# Patient Record
Sex: Male | Born: 1955 | Race: White | Hispanic: No | Marital: Married | Smoking: Never smoker
Health system: Southern US, Community
[De-identification: ages and names within clinical notes are randomized; demographics above are authoritative.]

## PROBLEM LIST (undated history)

## (undated) DIAGNOSIS — E119 Type 2 diabetes mellitus without complications: Secondary | ICD-10-CM

## (undated) DIAGNOSIS — G8929 Other chronic pain: Secondary | ICD-10-CM

## (undated) HISTORY — PX: NO PAST SURGERIES: SHX2092

## (undated) HISTORY — PX: APPENDECTOMY: SHX54

---

## 2004-05-24 ENCOUNTER — Ambulatory Visit: Payer: Self-pay | Admitting: Cardiovascular Disease

## 2004-12-23 ENCOUNTER — Emergency Department: Payer: Self-pay | Admitting: Emergency Medicine

## 2005-04-01 ENCOUNTER — Other Ambulatory Visit: Payer: Self-pay

## 2005-04-02 ENCOUNTER — Inpatient Hospital Stay: Payer: Self-pay | Admitting: General Surgery

## 2005-04-24 ENCOUNTER — Ambulatory Visit: Payer: Self-pay | Admitting: General Surgery

## 2005-05-18 ENCOUNTER — Ambulatory Visit: Payer: Self-pay | Admitting: General Surgery

## 2005-06-29 ENCOUNTER — Emergency Department: Payer: Self-pay | Admitting: Emergency Medicine

## 2005-08-26 ENCOUNTER — Emergency Department: Payer: Self-pay | Admitting: Emergency Medicine

## 2006-05-08 ENCOUNTER — Emergency Department (HOSPITAL_COMMUNITY): Admission: EM | Admit: 2006-05-08 | Discharge: 2006-05-09 | Payer: Self-pay | Admitting: Emergency Medicine

## 2007-01-01 ENCOUNTER — Emergency Department: Payer: Self-pay | Admitting: Emergency Medicine

## 2007-05-21 ENCOUNTER — Ambulatory Visit: Payer: Self-pay | Admitting: Internal Medicine

## 2007-06-25 ENCOUNTER — Other Ambulatory Visit: Payer: Self-pay

## 2007-06-25 ENCOUNTER — Ambulatory Visit: Payer: Self-pay | Admitting: Ophthalmology

## 2007-06-26 ENCOUNTER — Ambulatory Visit: Payer: Self-pay | Admitting: Ophthalmology

## 2007-07-16 ENCOUNTER — Ambulatory Visit: Payer: Self-pay | Admitting: Specialist

## 2008-08-03 ENCOUNTER — Emergency Department: Payer: Self-pay | Admitting: Emergency Medicine

## 2009-06-06 ENCOUNTER — Emergency Department: Payer: Self-pay | Admitting: Emergency Medicine

## 2009-08-19 ENCOUNTER — Ambulatory Visit: Payer: Self-pay | Admitting: Ophthalmology

## 2009-08-31 ENCOUNTER — Ambulatory Visit: Payer: Self-pay | Admitting: Ophthalmology

## 2009-10-25 ENCOUNTER — Emergency Department: Payer: Self-pay | Admitting: Unknown Physician Specialty

## 2010-08-10 ENCOUNTER — Emergency Department: Payer: Self-pay | Admitting: Unknown Physician Specialty

## 2010-12-02 ENCOUNTER — Emergency Department: Payer: Self-pay | Admitting: Emergency Medicine

## 2011-04-04 ENCOUNTER — Emergency Department: Payer: Self-pay | Admitting: Emergency Medicine

## 2011-05-11 ENCOUNTER — Emergency Department: Payer: Self-pay | Admitting: Emergency Medicine

## 2011-05-15 ENCOUNTER — Emergency Department: Payer: Self-pay | Admitting: Emergency Medicine

## 2011-07-06 ENCOUNTER — Ambulatory Visit: Payer: Self-pay | Admitting: Cardiovascular Disease

## 2011-12-18 ENCOUNTER — Emergency Department: Payer: Self-pay | Admitting: Emergency Medicine

## 2012-06-21 ENCOUNTER — Emergency Department: Payer: Self-pay | Admitting: Emergency Medicine

## 2012-10-08 ENCOUNTER — Emergency Department: Payer: Self-pay | Admitting: Emergency Medicine

## 2012-10-08 LAB — BASIC METABOLIC PANEL
Anion Gap: 6 — ABNORMAL LOW (ref 7–16)
BUN: 15 mg/dL (ref 7–18)
Calcium, Total: 9.2 mg/dL (ref 8.5–10.1)
Chloride: 108 mmol/L — ABNORMAL HIGH (ref 98–107)
Co2: 27 mmol/L (ref 21–32)
Creatinine: 0.71 mg/dL (ref 0.60–1.30)
EGFR (African American): >60
EGFR (Non-African Amer.): >60
Sodium: 141 mmol/L (ref 136–145)

## 2012-10-08 LAB — CBC
MCHC: 34.3 g/dL (ref 32.0–36.0)
Platelet: 286 10*3/uL (ref 150–440)
WBC: 10 10*3/uL (ref 3.8–10.6)

## 2012-10-08 LAB — CK TOTAL AND CKMB (NOT AT ARMC)
CK, Total: 56 U/L (ref 35–232)
CK-MB: 0.9 ng/mL (ref 0.5–3.6)

## 2014-03-26 ENCOUNTER — Emergency Department: Payer: Self-pay | Admitting: Emergency Medicine

## 2015-10-29 ENCOUNTER — Emergency Department
Admission: EM | Admit: 2015-10-29 | Discharge: 2015-10-29 | Disposition: A | Discharge status: Home / Self Care | Payer: Medicare Other | Attending: Emergency Medicine | Admitting: Emergency Medicine

## 2015-10-29 ENCOUNTER — Emergency Department: Payer: Medicare Other

## 2015-10-29 DIAGNOSIS — M545 Low back pain: Secondary | ICD-10-CM | POA: Diagnosis present

## 2015-10-29 DIAGNOSIS — M5442 Lumbago with sciatica, left side: Secondary | ICD-10-CM | POA: Diagnosis not present

## 2015-10-29 MED ORDER — MELOXICAM 15 MG PO TABS: 15.0000 mg | ORAL_TABLET | Freq: Every day | ORAL | 0 refills | Status: DC

## 2015-10-29 MED ORDER — METHOCARBAMOL 500 MG PO TABS: 500.0000 mg | ORAL_TABLET | Freq: Four times a day (QID) | ORAL | 0 refills | Status: DC

## 2015-10-29 MED ORDER — KETOROLAC TROMETHAMINE 30 MG/ML IJ SOLN
INTRAMUSCULAR | Status: AC
  Filled 2015-10-29: qty 1

## 2015-10-29 MED ORDER — HYDROCODONE-ACETAMINOPHEN 5-325 MG PO TABS: 1.0000 | ORAL_TABLET | ORAL | 0 refills | Status: DC | PRN

## 2015-10-29 MED ORDER — KETOROLAC TROMETHAMINE 30 MG/ML IJ SOLN
30.0000 mg | Freq: Once | INTRAMUSCULAR | Status: AC
  Administered 2015-10-29: 30 mg via INTRAMUSCULAR

## 2015-10-29 NOTE — ED Triage Notes (Signed)
Pt c/o lower back pain taht radiates up back pain and LLE pain that can accompany back pain. Pt denies injury at this time. Pt denies urinary sxs. Pt waslks w/ a cane.

## 2015-10-29 NOTE — ED Notes (Signed)
Pt c/o of mid back pain X 2 days that has been progressively increasing in severity. Pt was very tender to palpation of thoracic region of spine. Pt denies injury/lifting of heavy objects. Pt reports he had a prior injury (several years ago) to left leg and already has decreased mobility due to injury.

## 2015-10-29 NOTE — ED Notes (Signed)
Reviewed d/c instructions, follow-up care and prescriptions with pt. Pt verbalized understanding 

## 2015-10-29 NOTE — ED Provider Notes (Signed)
[facility] Emergency Department Provider Note  ____________________________________________  Time seen: Approximately 8:35 PM  I have reviewed the triage vital signs and the nursing notes.   HISTORY  Chief Complaint Back Pain    HPI Benjamin Castaneda is a 60 y.o. male who presents to emergency department complaining of lower back pain. Patient states that pain began 2 days ago while standing up from his chair. Patient reports midline pain that does not radiate. Patient reports that with sudden standing or twisting motions he has a sharp sensation in his back. He denies any bowel or bladder dysfunction, saddle anesthesia, paresthesias. Patient does have a history of left lower leg pain/numbness for about a previous injury and surgery. Patient reports that pain will occasionally radiate from his lower back towards his left leg. Patient has not tried any medications prior to arrival. He denies any other complaints. Pain is sharp, worse with standing or twisting, described as moderate to severe when this occurs.   No past medical history on file.  There are no active problems to display for this patient.   No past surgical history on file.  Prior to Admission medications   Medication Sig Start Date End Date Taking? Authorizing Provider  HYDROcodone-acetaminophen (NORCO/VICODIN) 5-325 MG tablet Take 1 tablet by mouth every 4 (four) hours as needed for moderate pain. 10/29/15   Delorise Royals Cuthriell, PA-C  meloxicam (MOBIC) 15 MG tablet Take 1 tablet (15 mg total) by mouth daily. 10/29/15   Delorise Royals Cuthriell, PA-C  methocarbamol (ROBAXIN) 500 MG tablet Take 1 tablet (500 mg total) by mouth 4 (four) times daily. 10/29/15   Delorise Royals Cuthriell, PA-C    Allergies Review of patient's allergies indicates no known allergies.  No family history on file.  Social History Social History  Substance Use Topics  . Smoking status: Not on file  . Smokeless tobacco: Not on  file  . Alcohol use Not on file     Review of Systems  Constitutional: No fever/chills Cardiovascular: no chest pain. Respiratory: no cough. No SOB. Gastrointestinal: No abdominal pain.  No nausea, no vomiting.  No diarrhea.  No constipation. Genitourinary: Negative for dysuria. No hematuria Musculoskeletal: Positive for mid and lower back pain Skin: Negative for rash, abrasions, lacerations, ecchymosis. Neurological: Negative for headaches, focal weakness or numbness. 10-point ROS otherwise negative.  ____________________________________________   PHYSICAL EXAM:  VITAL SIGNS: ED Triage Vitals  Enc Vitals Group     BP 10/29/15 2014 (!) 152/72     Pulse Rate 10/29/15 2014 75     Resp 10/29/15 2014 20     Temp 10/29/15 2014 98.2 F (36.8 C)     Temp Source 10/29/15 2014 Oral     SpO2 10/29/15 2014 97 %     Weight 10/29/15 2014 252 lb (114.3 kg)     Height 10/29/15 2014  (1.88 m)     Head Circumference --      Peak Flow --      Pain Score 10/29/15 2015 10     Pain Loc --      Pain Edu? --      Excl. in GC? --      Constitutional: Alert and oriented. Well appearing and in no acute distress. Eyes: Conjunctivae are normal. PERRL. EOMI. Head: Atraumatic Neck: No stridor.  No cervical spine tenderness to palpation.  Cardiovascular: Normal rate, regular rhythm. Normal S1 and S2.  Good peripheral circulation. Respiratory: Normal respiratory effort without tachypnea or  retractions. Lungs CTAB. Good air entry to the bases with no decreased or absent breath sounds. Gastrointestinal: Bowel sounds 4 quadrants. Soft and nontender to palpation. No guarding or rigidity. No palpable masses. No distention. No CVA tenderness. Musculoskeletal: Full range of motion to all extremities. No gross deformities appreciated. Patient is mildly tender to palpation over the lower thoracic and upper lumbar spine. No point tenderness. No palpable abnormality. Patient is also tender to palpation  left sided paraspinal muscle groups. Sensation intact and equal lower shin knees. Pulses intact and equal lower extremities. Neurologic:  Normal speech and language. No gross focal neurologic deficits are appreciated.  Skin:  Skin is warm, dry and intact. No rash noted. Psychiatric: Mood and affect are normal. Speech and behavior are normal. Patient exhibits appropriate insight and judgement.   ____________________________________________   LABS (all labs ordered are listed, but only abnormal results are displayed)  Labs Reviewed - No data to display ____________________________________________  EKG   ____________________________________________  RADIOLOGY Festus BarrenI, Jonathan D Cuthriell, personally viewed and evaluated these images (plain radiographs) as part of my medical decision making, as well as reviewing the written report by the radiologist.  Dg Thoracic Spine 2 View  Result Date: 10/29/2015 CLINICAL DATA:  Pt c/o lower back pain taht radiates up back pain and LLE pain that can accompany back pain. Pt denies injury at this time. EXAM: THORACIC SPINE 2 VIEWS COMPARISON:  None. FINDINGS: No fracture.  No spondylolisthesis. Minor disc degenerative changes noted along the mid and lower thoracic spine reflected by small endplate osteophytes. No bone lesion.  Bones are demineralized. Soft tissues are unremarkable. IMPRESSION: No fracture or acute finding.  Minor degenerative changes. Electronically Signed   By: Amie Portlandavid  Ormond M.D.   On: 10/29/2015 21:16   Dg Lumbar Spine Complete  Result Date: 10/29/2015 CLINICAL DATA:  Pt c/o lower back pain taht radiates up back pain and LLE pain that can accompany back pain. Pt denies injury at this time. EXAM: LUMBAR SPINE - COMPLETE 4+ VIEW COMPARISON:  None. FINDINGS: No fracture.  No spondylolisthesis. Mild loss of disc height throughout the lumbar spine. There are endplate osteophytes throughout the lumbar spine. Facet joints are relatively well  preserved. Bones are demineralized. Soft tissues are unremarkable. IMPRESSION: No fracture or acute finding. Mild diffuse disc degenerative change as described. Electronically Signed   By: Amie Portlandavid  Ormond M.D.   On: 10/29/2015 21:15    ____________________________________________    PROCEDURES  Procedure(s) performed:    Procedures    Medications - No data to display   ____________________________________________   INITIAL IMPRESSION / ASSESSMENT AND PLAN / ED COURSE  Pertinent labs & imaging results that were available during my care of the patient were reviewed by me and considered in my medical decision making (see chart for details).  Clinical Course    Patient's diagnosis is consistent with Acute lumbago with left-sided sciatica. X-rays reveal no acute osseous abnormality. Patient is neurologically intact distally both lower extremities. 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 Patient is given a shot of Toradol emergency department for symptomatic control.. Patient will be discharged home with prescriptions for anti-inflammatories and muscle relaxer, as well as limited pain medication. Patient is to follow up with primary care provider as needed or otherwise directed. Patient is given ED precautions to return to the ED for any worsening or new symptoms.     ____________________________________________  FINAL CLINICAL IMPRESSION(S) / ED DIAGNOSES  Final diagnoses:  Midline low back pain with left-sided sciatica      NEW MEDICATIONS STARTED DURING THIS VISIT:  New Prescriptions   HYDROCODONE-ACETAMINOPHEN (NORCO/VICODIN) 5-325 MG TABLET    Take 1 tablet by mouth every 4 (four) hours as needed for moderate pain.   MELOXICAM (MOBIC) 15 MG TABLET    Take 1 tablet (15 mg total) by mouth daily.   METHOCARBAMOL (ROBAXIN) 500 MG TABLET    Take 1 tablet (500 mg total) by mouth 4 (four) times daily.        This chart  was dictated using voice recognition software/Dragon. Despite best efforts to proofread, errors can occur which can change the meaning. Any change was purely unintentional.    Racheal Patches, PA-C 10/29/15 2141    Myrna Blazer, MD 10/29/15 [phone]

## 2016-01-31 ENCOUNTER — Emergency Department: Payer: Medicare Other

## 2016-01-31 ENCOUNTER — Emergency Department
Admission: EM | Admit: 2016-01-31 | Discharge: 2016-01-31 | Disposition: A | Discharge status: Home / Self Care | Payer: Medicare Other | Attending: Emergency Medicine | Admitting: Emergency Medicine

## 2016-01-31 DIAGNOSIS — Y929 Unspecified place or not applicable: Secondary | ICD-10-CM | POA: Insufficient documentation

## 2016-01-31 DIAGNOSIS — S20212A Contusion of left front wall of thorax, initial encounter: Secondary | ICD-10-CM | POA: Diagnosis not present

## 2016-01-31 DIAGNOSIS — Y9389 Activity, other specified: Secondary | ICD-10-CM | POA: Diagnosis not present

## 2016-01-31 DIAGNOSIS — W010XXA Fall on same level from slipping, tripping and stumbling without subsequent striking against object, initial encounter: Secondary | ICD-10-CM | POA: Diagnosis not present

## 2016-01-31 DIAGNOSIS — S39012A Strain of muscle, fascia and tendon of lower back, initial encounter: Secondary | ICD-10-CM | POA: Insufficient documentation

## 2016-01-31 DIAGNOSIS — Y999 Unspecified external cause status: Secondary | ICD-10-CM | POA: Diagnosis not present

## 2016-01-31 DIAGNOSIS — S3992XA Unspecified injury of lower back, initial encounter: Secondary | ICD-10-CM | POA: Diagnosis present

## 2016-01-31 MED ORDER — TRAMADOL HCL 50 MG PO TABS: 50.0000 mg | ORAL_TABLET | Freq: Four times a day (QID) | ORAL | 0 refills | Status: AC | PRN

## 2016-01-31 MED ORDER — CYCLOBENZAPRINE HCL 10 MG PO TABS: 10.0000 mg | ORAL_TABLET | Freq: Three times a day (TID) | ORAL | 0 refills | Status: DC | PRN

## 2016-01-31 MED ORDER — KETOROLAC TROMETHAMINE 60 MG/2ML IM SOLN
30.0000 mg | Freq: Once | INTRAMUSCULAR | Status: AC
  Administered 2016-01-31: 30 mg via INTRAMUSCULAR
  Filled 2016-01-31: qty 2

## 2016-01-31 MED ORDER — ORPHENADRINE CITRATE 30 MG/ML IJ SOLN
60.0000 mg | Freq: Two times a day (BID) | INTRAMUSCULAR | Status: DC
  Administered 2016-01-31: 60 mg via INTRAMUSCULAR
  Filled 2016-01-31: qty 2

## 2016-01-31 NOTE — ED Triage Notes (Signed)
Pt slipped on mud and fell on his R side, reports back pain

## 2016-01-31 NOTE — ED Provider Notes (Signed)
[facility] Emergency Department Provider Note   ____________________________________________   None    (approximate)  I have reviewed the triage vital signs and the nursing notes.   HISTORY  Chief Complaint Back Pain    HPI Benjamin Castaneda is a 60 y.o. male patient complain of left rib pain and back pain today secondary to a slip and fall this morning. 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 Patient state pain is increased and worsened with deep inspirations.Patient rates pain as a 10 over 10. Patient described a pain as "sharp". No palliative measures taken for this complaint.   History reviewed. No pertinent past medical history.  There are no active problems to display for this patient.   History reviewed. No pertinent surgical history.  Prior to Admission medications   Medication Sig Start Date End Date Taking? Authorizing Provider  cyclobenzaprine (FLEXERIL) 10 MG tablet Take 1 tablet (10 mg total) by mouth 3 (three) times daily as needed. 01/31/16   Joni Reiningonald K Hollick, PA-C  HYDROcodone-acetaminophen (NORCO/VICODIN) 5-325 MG tablet Take 1 tablet by mouth every 4 (four) hours as needed for moderate pain. 10/29/15   Delorise RoyalsJonathan D Cuthriell, PA-C  meloxicam (MOBIC) 15 MG tablet Take 1 tablet (15 mg total) by mouth daily. 10/29/15   Delorise RoyalsJonathan D Cuthriell, PA-C  methocarbamol (ROBAXIN) 500 MG tablet Take 1 tablet (500 mg total) by mouth 4 (four) times daily. 10/29/15   Delorise RoyalsJonathan D Cuthriell, PA-C  traMADol (ULTRAM) 50 MG tablet Take 1 tablet (50 mg total) by mouth every 6 (six) hours as needed. 01/31/16 01/30/17  Joni Reiningonald K Traeger, PA-C    Allergies Patient has no known allergies.  No family history on file.  Social History Social History  Substance Use Topics  . Smoking status: Never Smoker  . Smokeless tobacco: Never Used  . Alcohol use No    Review of Systems Constitutional: No fever/chills Eyes: No visual  changes. ENT: No sore throat. Cardiovascular: Denies chest pain. Respiratory: Denies shortness of breath. Gastrointestinal: No abdominal pain.  No nausea, no vomiting.  No diarrhea.  No constipation. Genitourinary: Negative for dysuria. Musculoskeletal: Positive for left rib and back pain. Skin: Negative for rash. Neurological: Negative for headaches, focal weakness or numbness.    ____________________________________________   PHYSICAL EXAM:  VITAL SIGNS: ED Triage Vitals  Enc Vitals Group     BP 01/31/16 1642 (!) 147/74     Pulse Rate 01/31/16 1642 86     Resp 01/31/16 1642 16     Temp 01/31/16 1642 97.9 F (36.6 C)     Temp Source 01/31/16 1642 Oral     SpO2 01/31/16 1642 96 %     Weight 01/31/16 1639 262 lb (118.8 kg)     Height --      Head Circumference --      Peak Flow --      Pain Score 01/31/16 1639 10     Pain Loc --      Pain Edu? --      Excl. in GC? --     Constitutional: Alert and oriented. Well appearing and in no acute distress. Eyes: Conjunctivae are normal. PERRL. EOMI. Head: Atraumatic. Nose: No congestion/rhinnorhea. Mouth/Throat: Mucous membranes are moist.  Oropharynx non-erythematous. Neck: No stridor.  No cervical spine tenderness to palpation. Hematological/Lymphatic/Immunilogical: No cervical lymphadenopathy. Cardiovascular: Normal rate, regular rhythm. Grossly normal heart sounds.  Good peripheral circulation. Respiratory: Normal respiratory effort.  No retractions. Lungs CTAB.  Gastrointestinal: Soft and nontender. No distention. No abdominal bruits. No CVA tenderness. Musculoskeletal: No lower extremity tenderness nor edema.  No joint effusions. Neurologic:  Normal speech and language. No gross focal neurologic deficits are appreciated. No gait instability. Skin:  Skin is warm, dry and intact. No rash noted. Psychiatric: Mood and affect are normal. Speech and behavior are normal.  ____________________________________________    LABS (all labs ordered are listed, but only abnormal results are displayed)  Labs Reviewed - No data to display ____________________________________________  EKG   ____________________________________________  RADIOLOGY  No acute findings x-rays of the back and ribs. Patient has moderate degenerative changes in the lumbar spine. ____________________________________________   PROCEDURES  Procedure(s) performed: None  Procedures  Critical Care performed: No  ____________________________________________   INITIAL IMPRESSION / ASSESSMENT AND PLAN / ED COURSE  Pertinent labs & imaging results that were available during my care of the patient were reviewed by me and considered in my medical decision making (see chart for details).  Left rib contusion and back pain secondary to fall. Patient given discharge care instructions. Patient given prescription for tramadol and Flexeril. Patient advised follow-up family doctor condition persists.  Clinical Course      ____________________________________________   FINAL CLINICAL IMPRESSION(S) / ED DIAGNOSES  Final diagnoses:  Strain of lumbar region, initial encounter  Rib contusion, left, initial encounter      NEW MEDICATIONS STARTED DURING THIS VISIT:  New Prescriptions   CYCLOBENZAPRINE (FLEXERIL) 10 MG TABLET    Take 1 tablet (10 mg total) by mouth 3 (three) times daily as needed.   TRAMADOL (ULTRAM) 50 MG TABLET    Take 1 tablet (50 mg total) by mouth every 6 (six) hours as needed.     Note:  This document was prepared using Dragon voice recognition software and may include unintentional dictation errors.    Joni Reiningonald K Crittendon, PA-C 01/31/16 1813    Minna AntisKevin Paduchowski, MD 01/31/16 [phone]

## 2016-08-20 ENCOUNTER — Encounter: Payer: Self-pay | Admitting: Emergency Medicine

## 2016-08-20 ENCOUNTER — Emergency Department: Payer: Medicare Other

## 2016-08-20 ENCOUNTER — Emergency Department
Admission: EM | Admit: 2016-08-20 | Discharge: 2016-08-20 | Disposition: A | Discharge status: Home / Self Care | Payer: Medicare Other | Attending: Emergency Medicine | Admitting: Emergency Medicine

## 2016-08-20 DIAGNOSIS — Z79899 Other long term (current) drug therapy: Secondary | ICD-10-CM | POA: Insufficient documentation

## 2016-08-20 DIAGNOSIS — J209 Acute bronchitis, unspecified: Secondary | ICD-10-CM | POA: Diagnosis not present

## 2016-08-20 DIAGNOSIS — E119 Type 2 diabetes mellitus without complications: Secondary | ICD-10-CM | POA: Insufficient documentation

## 2016-08-20 DIAGNOSIS — R05 Cough: Secondary | ICD-10-CM | POA: Diagnosis present

## 2016-08-20 HISTORY — DX: Type 2 diabetes mellitus without complications: E11.9

## 2016-08-20 MED ORDER — IPRATROPIUM-ALBUTEROL 0.5-2.5 (3) MG/3ML IN SOLN
3.0000 mL | Freq: Once | RESPIRATORY_TRACT | Status: AC
  Administered 2016-08-20: 3 mL via RESPIRATORY_TRACT
  Filled 2016-08-20: qty 3

## 2016-08-20 MED ORDER — AZITHROMYCIN 250 MG PO TABS: ORAL_TABLET | ORAL | 0 refills | Status: DC

## 2016-08-20 MED ORDER — AZITHROMYCIN 500 MG PO TABS
500.0000 mg | ORAL_TABLET | Freq: Every day | ORAL | Status: DC
  Administered 2016-08-20: 500 mg via ORAL
  Filled 2016-08-20: qty 1

## 2016-08-20 MED ORDER — BENZONATATE 100 MG PO CAPS: 100.0000 mg | ORAL_CAPSULE | Freq: Three times a day (TID) | ORAL | 0 refills | Status: AC | PRN

## 2016-08-20 MED ORDER — ALBUTEROL SULFATE HFA 108 (90 BASE) MCG/ACT IN AERS: 2.0000 | INHALATION_SPRAY | Freq: Four times a day (QID) | RESPIRATORY_TRACT | 0 refills | Status: DC | PRN

## 2016-08-20 NOTE — ED Notes (Signed)
Pt gone to xray via stretcher

## 2016-08-20 NOTE — ED Triage Notes (Signed)
Patient with complaint of cough times one month.

## 2016-08-20 NOTE — ED Provider Notes (Signed)
[facility] Emergency Department Provider Note  ____________________________________________  Time seen: Approximately 8:39 PM  I have reviewed the triage vital signs and the nursing notes.   HISTORY  Chief Complaint Cough    HPI Benjamin Castaneda is a 61 y.o. male that presents to the emergency department with cough for over one month. Sometimes he is coughing so much that he has trouble breathing. He is not coughing anything up. He has been taking over-the-counter cough syrup for symptoms, which have not helped. He does not smoke. No history of allergies or asthma. He has diabetes and takes medication. His blood sugar this morning was 220. He denies fever, nasal congestion, chest pain, nausea, vomiting, abdominal pain, diarrhea, constipation.   Past Medical History:  Diagnosis Date  . Diabetes mellitus without complication (HCC)     There are no active problems to display for this patient.   Past Surgical History:  Procedure Laterality Date  . APPENDECTOMY      Prior to Admission medications   Medication Sig Start Date End Date Taking? Authorizing Provider  albuterol (PROVENTIL HFA;VENTOLIN HFA) 108 (90 Base) MCG/ACT inhaler Inhale 2 puffs into the lungs every 6 (six) hours as needed for wheezing or shortness of breath. 08/20/16   Enid Derry, PA-C  azithromycin (ZITHROMAX Z-PAK) 250 MG tablet Take 2 tablets (500 mg) on  Day 1,  followed by 1 tablet (250 mg) once daily on Days 2 through 5. 08/20/16   Enid Derry, PA-C  benzonatate (TESSALON PERLES) 100 MG capsule Take 1 capsule (100 mg total) by mouth 3 (three) times daily as needed for cough. 08/20/16 08/20/17  Enid Derry, PA-C  cyclobenzaprine (FLEXERIL) 10 MG tablet Take 1 tablet (10 mg total) by mouth 3 (three) times daily as needed. 01/31/16   Joni Reining, PA-C  HYDROcodone-acetaminophen (NORCO/VICODIN) 5-325 MG tablet Take 1 tablet by mouth every 4 (four) hours as needed for moderate pain.  10/29/15   Cuthriell, Delorise Royals, PA-C  meloxicam (MOBIC) 15 MG tablet Take 1 tablet (15 mg total) by mouth daily. 10/29/15   Cuthriell, Delorise Royals, PA-C  methocarbamol (ROBAXIN) 500 MG tablet Take 1 tablet (500 mg total) by mouth 4 (four) times daily. 10/29/15   Cuthriell, Delorise Royals, PA-C  traMADol (ULTRAM) 50 MG tablet Take 1 tablet (50 mg total) by mouth every 6 (six) hours as needed. 01/31/16 01/30/17  Joni Reining, PA-C    Allergies Patient has no known allergies.  No family history on file.  Social History Social History  Substance Use Topics  . Smoking status: Never Smoker  . Smokeless tobacco: Never Used  . Alcohol use No     Review of Systems  Constitutional: No fever/chills Eyes: No visual changes. No discharge. ENT: Positive for congestion and rhinorrhea. Cardiovascular: No chest pain. Respiratory: Positive for cough. No SOB. Gastrointestinal: No abdominal pain.  No nausea, no vomiting.  No diarrhea.  No constipation. Musculoskeletal: Negative for musculoskeletal pain. Skin: Negative for rash, abrasions, lacerations, ecchymosis. Neurological: Negative for headaches.   ____________________________________________   PHYSICAL EXAM:  VITAL SIGNS: ED Triage Vitals  Enc Vitals Group     BP 08/20/16 1956 (!) 158/85     Pulse Rate 08/20/16 1953 74     Resp 08/20/16 1953 18     Temp 08/20/16 1953 97.6 F (36.4 C)     Temp Source 08/20/16 1953 Oral     SpO2 08/20/16 1953 98 %     Weight 08/20/16 2002 258 lb (  117 kg)     Height 08/20/16 1953 6\' 2"  (1.88 m)     Head Circumference --      Peak Flow --      Pain Score 08/20/16 2002 8     Pain Loc --      Pain Edu? --      Excl. in GC? --      Eyes: Conjunctivae are normal. PERRL. EOMI. No discharge. Head: Atraumatic. ENT: No frontal and maxillary sinus tenderness.      Ears: Tympanic membranes pearly gray with good landmarks. No discharge.      Nose: No congestion/rhinnorhea.      Mouth/Throat: Mucous  membranes are moist. Oropharynx non-erythematous. Neck: No stridor.   Hematological/Lymphatic/Immunilogical: No cervical lymphadenopathy. Cardiovascular: Normal rate, regular rhythm.  Good peripheral circulation. Respiratory: Normal respiratory effort without tachypnea or retractions. Lungs CTAB. Good air entry to the bases with no decreased or absent breath sounds. Gastrointestinal: Bowel sounds 4 quadrants. Soft and nontender to palpation. No guarding or rigidity. No palpable masses. No distention. Musculoskeletal: Full range of motion to all extremities. No gross deformities appreciated. Neurologic:  Normal speech and language. No gross focal neurologic deficits are appreciated.  Skin:  Skin is warm, dry and intact. No rash noted.   ____________________________________________   LABS (all labs ordered are listed, but only abnormal results are displayed)  Labs Reviewed - No data to display ____________________________________________  EKG   ____________________________________________  RADIOLOGY Lexine Baton, personally viewed and evaluated these images (plain radiographs) as part of my medical decision making, as well as reviewing the written report by the radiologist.  Dg Chest 2 View  Result Date: 08/20/2016 CLINICAL DATA:  Cough for 1 month EXAM: CHEST  2 VIEW COMPARISON:  01/31/2016 FINDINGS: The heart size and mediastinal contours are within normal limits. Both lungs are clear. The visualized skeletal structures are unremarkable. IMPRESSION: No active cardiopulmonary disease. Electronically Signed   By: Alcide Clever M.D.   On: 08/20/2016 20:51    ____________________________________________    PROCEDURES  Procedure(s) performed:    Procedures    Medications  azithromycin (ZITHROMAX) tablet 500 mg (500 mg Oral Given 08/20/16 2111)  ipratropium-albuterol (DUONEB) 0.5-2.5 (3) MG/3ML nebulizer solution 3 mL (3 mLs Nebulization Given 08/20/16 2111)      ____________________________________________   INITIAL IMPRESSION / ASSESSMENT AND PLAN / ED COURSE  Pertinent labs & imaging results that were available during my care of the patient were reviewed by me and considered in my medical decision making (see chart for details).  Review of the [place] CSRS was performed in accordance of the [employer] prior to dispensing any controlled drugs.   Patient's diagnosis is consistent with Bronchitis. Vital signs and exam are reassuring. Chest x-ray negative for acute cardiopulmonary processes. Patient is diabetic. His blood sugar was 220 this morning so I'll hold off on steroids now. He has an appointment with his primary care provider in 4 days. Patient feels comfortable going home. Patient will be discharged home with prescriptions for azithromycin and albuterol inhaler. Patient is to follow up with PCP as needed or otherwise directed. Patient is given ED precautions to return to the ED for any worsening or new symptoms.     ____________________________________________  FINAL CLINICAL IMPRESSION(S) / ED DIAGNOSES  Final diagnoses:  Acute bronchitis, unspecified organism      NEW MEDICATIONS STARTED DURING THIS VISIT:  Discharge Medication List as of 08/20/2016  9:24 PM    START taking these medications  Details  albuterol (PROVENTIL HFA;VENTOLIN HFA) 108 (90 Base) MCG/ACT inhaler Inhale 2 puffs into the lungs every 6 (six) hours as needed for wheezing or shortness of breath., Starting Sun 08/20/2016, Print    azithromycin (ZITHROMAX Z-PAK) 250 MG tablet Take 2 tablets (500 mg) on  Day 1,  followed by 1 tablet (250 mg) once daily on Days 2 through 5., Print    benzonatate (TESSALON PERLES) 100 MG capsule Take 1 capsule (100 mg total) by mouth 3 (three) times daily as needed for cough., Starting Sun 08/20/2016, Until Mon 08/20/2017, Print            This chart was dictated using voice recognition software/Dragon. Despite best efforts to  proofread, errors can occur which can change the meaning. Any change was purely unintentional.    Enid DerryWagner, Remberto Lienhard, PA-C 08/20/16 2159    Rockne MenghiniNorman, Anne-Caroline, MD 08/20/16 2328

## 2016-08-24 ENCOUNTER — Other Ambulatory Visit: Payer: Self-pay | Admitting: Obstetrics and Gynecology

## 2016-08-24 DIAGNOSIS — R740 Nonspecific elevation of levels of transaminase and lactic acid dehydrogenase [LDH]: Principal | ICD-10-CM

## 2016-08-24 DIAGNOSIS — R7401 Elevation of levels of liver transaminase levels: Secondary | ICD-10-CM

## 2016-08-30 ENCOUNTER — Ambulatory Visit
Admission: RE | Admit: 2016-08-30 | Discharge: 2016-08-30 | Disposition: A | Discharge status: Home / Self Care | Payer: Medicare Other | Source: Ambulatory Visit | Attending: Obstetrics and Gynecology | Admitting: Obstetrics and Gynecology

## 2016-08-30 DIAGNOSIS — R7401 Elevation of levels of liver transaminase levels: Secondary | ICD-10-CM

## 2016-08-30 DIAGNOSIS — K8020 Calculus of gallbladder without cholecystitis without obstruction: Secondary | ICD-10-CM | POA: Insufficient documentation

## 2016-08-30 DIAGNOSIS — R740 Nonspecific elevation of levels of transaminase and lactic acid dehydrogenase [LDH]: Secondary | ICD-10-CM | POA: Insufficient documentation

## 2016-08-30 DIAGNOSIS — R932 Abnormal findings on diagnostic imaging of liver and biliary tract: Secondary | ICD-10-CM | POA: Diagnosis not present

## 2016-12-21 ENCOUNTER — Other Ambulatory Visit: Payer: Self-pay | Admitting: Obstetrics and Gynecology

## 2017-01-25 ENCOUNTER — Other Ambulatory Visit: Payer: Self-pay

## 2017-01-25 ENCOUNTER — Emergency Department
Admission: EM | Admit: 2017-01-25 | Discharge: 2017-01-25 | Disposition: A | Discharge status: Home / Self Care | Payer: Medicare Other | Attending: Emergency Medicine | Admitting: Emergency Medicine

## 2017-01-25 DIAGNOSIS — M62830 Muscle spasm of back: Secondary | ICD-10-CM | POA: Insufficient documentation

## 2017-01-25 DIAGNOSIS — Z79899 Other long term (current) drug therapy: Secondary | ICD-10-CM | POA: Insufficient documentation

## 2017-01-25 DIAGNOSIS — E119 Type 2 diabetes mellitus without complications: Secondary | ICD-10-CM | POA: Diagnosis not present

## 2017-01-25 DIAGNOSIS — M62838 Other muscle spasm: Secondary | ICD-10-CM

## 2017-01-25 DIAGNOSIS — M545 Low back pain: Secondary | ICD-10-CM | POA: Diagnosis present

## 2017-01-25 MED ORDER — KETOROLAC TROMETHAMINE 30 MG/ML IJ SOLN
30.0000 mg | Freq: Once | INTRAMUSCULAR | Status: AC
  Administered 2017-01-25: 30 mg via INTRAMUSCULAR
  Filled 2017-01-25: qty 1

## 2017-01-25 MED ORDER — KETOROLAC TROMETHAMINE 10 MG PO TABS: 10.0000 mg | ORAL_TABLET | Freq: Four times a day (QID) | ORAL | 0 refills | Status: AC | PRN

## 2017-01-25 MED ORDER — ORPHENADRINE CITRATE ER 100 MG PO TB12: 100.0000 mg | ORAL_TABLET | Freq: Two times a day (BID) | ORAL | 1 refills | Status: AC

## 2017-01-25 MED ORDER — ORPHENADRINE CITRATE 30 MG/ML IJ SOLN
60.0000 mg | Freq: Two times a day (BID) | INTRAMUSCULAR | Status: DC
  Filled 2017-01-25: qty 2

## 2017-01-25 NOTE — ED Triage Notes (Signed)
Says back pain for more than one month.

## 2017-01-25 NOTE — ED Provider Notes (Signed)
[facility] Emergency Department Provider Note  ____________________________________________  Time seen: Approximately 3:18 PM  I have reviewed the triage vital signs and the nursing notes.   HISTORY  Chief Complaint Back Pain    HPI Benjamin Castaneda is a 61 y.o. male presents to the emergency department with 10 out of 10 low back pain with radiation up the back.  Patient describes back pain as sharp in nature.  Patient reports that pain is reproducible with palpation of the paraspinal muscles of the thoracic spine.  He denies weakness, radiculopathy, instances of trauma, bowel or bladder incontinence or instability.  Patient has taken Aleve but has attempted no other alleviating measures.   Past Medical History:  Diagnosis Date  . Diabetes mellitus without complication (HCC)     There are no active problems to display for this patient.   Past Surgical History:  Procedure Laterality Date  . APPENDECTOMY      Prior to Admission medications   Medication Sig Start Date End Date Taking? Authorizing Provider  albuterol (PROVENTIL HFA;VENTOLIN HFA) 108 (90 Base) MCG/ACT inhaler Inhale 2 puffs into the lungs every 6 (six) hours as needed for wheezing or shortness of breath. 08/20/16   Enid DerryWagner, Ashley, PA-C  azithromycin (ZITHROMAX Z-PAK) 250 MG tablet Take 2 tablets (500 mg) on  Day 1,  followed by 1 tablet (250 mg) once daily on Days 2 through 5. 08/20/16   Enid DerryWagner, Ashley, PA-C  benzonatate (TESSALON PERLES) 100 MG capsule Take 1 capsule (100 mg total) by mouth 3 (three) times daily as needed for cough. 08/20/16 08/20/17  Enid DerryWagner, Ashley, PA-C  cyclobenzaprine (FLEXERIL) 10 MG tablet Take 1 tablet (10 mg total) by mouth 3 (three) times daily as needed. 01/31/16   Joni ReiningSmith, Ronald K, PA-C  HYDROcodone-acetaminophen (NORCO/VICODIN) 5-325 MG tablet Take 1 tablet by mouth every 4 (four) hours as needed for moderate pain. 10/29/15   Cuthriell, Delorise RoyalsJonathan D, PA-C  ketorolac (TORADOL)  10 MG tablet Take 1 tablet (10 mg total) every 6 (six) hours as needed for up to 5 days by mouth. 01/25/17 01/30/17  Orvil FeilWoods, Jaclyn M, PA-C  meloxicam (MOBIC) 15 MG tablet Take 1 tablet (15 mg total) by mouth daily. 10/29/15   Cuthriell, Delorise RoyalsJonathan D, PA-C  methocarbamol (ROBAXIN) 500 MG tablet Take 1 tablet (500 mg total) by mouth 4 (four) times daily. 10/29/15   Cuthriell, Delorise RoyalsJonathan D, PA-C  orphenadrine (NORFLEX) 100 MG tablet Take 1 tablet (100 mg total) 2 (two) times daily for 10 days by mouth. 01/25/17 02/04/17  Orvil FeilWoods, Jaclyn M, PA-C  traMADol (ULTRAM) 50 MG tablet Take 1 tablet (50 mg total) by mouth every 6 (six) hours as needed. 01/31/16 01/30/17  Joni ReiningSmith, Ronald K, PA-C    Allergies Patient has no known allergies.  No family history on file.  Social History Social History   Tobacco Use  . Smoking status: Never Smoker  . Smokeless tobacco: Never Used  Substance Use Topics  . Alcohol use: No  . Drug use: Not on file     Review of Systems  Constitutional: No fever/chills Eyes: No visual changes. No discharge ENT: No upper respiratory complaints. Cardiovascular: no chest pain. Respiratory: no cough. No SOB. Gastrointestinal: No abdominal pain.  No nausea, no vomiting.  No diarrhea.  No constipation. Genitourinary: Negative for dysuria. No hematuria Musculoskeletal: Patient has back pain Skin: Negative for rash, abrasions, lacerations, ecchymosis. Neurological: Negative for headaches, focal weakness or numbness.   ____________________________________________   PHYSICAL EXAM:  VITAL  SIGNS: ED Triage Vitals  Enc Vitals Group     BP 01/25/17 1434 (!) 147/69     Pulse Rate 01/25/17 1434 67     Resp 01/25/17 1434 16     Temp 01/25/17 1434 98 F (36.7 C)     Temp Source 01/25/17 1434 Oral     SpO2 01/25/17 1434 97 %     Weight 01/25/17 1434 250 lb (113.4 kg)     Height 01/25/17 1434 6\' 8"  (2.032 m)     Head Circumference --      Peak Flow --      Pain Score 01/25/17 1433  10     Pain Loc --      Pain Edu? --      Excl. in GC? --      Constitutional: Alert and oriented. Well appearing and in no acute distress. Eyes: Conjunctivae are normal. PERRL. EOMI. Head: Atraumatic. Cardiovascular: Normal rate, regular rhythm. Normal S1 and S2.  Good peripheral circulation. Respiratory: Normal respiratory effort without tachypnea or retractions. Lungs CTAB. Good air entry to the bases with no decreased or absent breath sounds. Gastrointestinal: Bowel sounds 4 quadrants. Soft and nontender to palpation. No guarding or rigidity. No palpable masses. No distention. No CVA tenderness. Musculoskeletal: Patient has paraspinal muscle tenderness along the lumbar spine and thoracic spine.  Negative straight leg raise test bilaterally.  Palpable dorsalis pedis pulse bilaterally and symmetrically. Neurologic:  Normal speech and language. No gross focal neurologic deficits are appreciated.  Skin:  Skin is warm, dry and intact. No rash noted. Psychiatric: Mood and affect are normal. Speech and behavior are normal. Patient exhibits appropriate insight and judgement.   ____________________________________________   LABS (all labs ordered are listed, but only abnormal results are displayed)  Labs Reviewed - No data to display ____________________________________________  EKG   ____________________________________________  RADIOLOGY   No results found.  ____________________________________________    PROCEDURES  Procedure(s) performed:    Procedures    Medications  ketorolac (TORADOL) 30 MG/ML injection 30 mg (30 mg Intramuscular Given 01/25/17 1529)     ____________________________________________   INITIAL IMPRESSION / ASSESSMENT AND PLAN / ED COURSE  Pertinent labs & imaging results that were available during my care of the patient were reviewed by me and considered in my medical decision making (see chart for details).  Review of the [place] CSRS was  performed in accordance of the [facility] prior to dispensing any controlled drugs.     Assessment and plan Low back pain Differential diagnosis includes lumbar strain versus sciatica versus muscle spasm. Patient presents to the emergency department with muscle spasms along the lumbar spine and thoracic spine.  Patient was given injections of Toradol in the emergency department.  Patient was discharged with Norflex and oral Toradol.  He was advised to follow-up with primary care as needed.  All patient questions were answered.    ____________________________________________  FINAL CLINICAL IMPRESSION(S) / ED DIAGNOSES  Final diagnoses:  Muscle spasm      NEW MEDICATIONS STARTED DURING THIS VISIT:  ED Discharge Orders        Ordered    orphenadrine (NORFLEX) 100 MG tablet  2 times daily     01/25/17 1617    ketorolac (TORADOL) 10 MG tablet  Every 6 hours PRN     01/25/17 1617          This chart was dictated using voice recognition software/Dragon. Despite best efforts to proofread, errors can occur which can change  the meaning. Any change was purely unintentional.    Woods, Jaclyn M, PA-C 11/08/Orvil Feil18 1621    Jeanmarie PlantMcShane, Nickie A, MD 01/25/17 [phone]

## 2017-01-25 NOTE — ED Notes (Signed)
Pt slowly ambulated to bed without any assistance with a grimace face

## 2017-05-21 ENCOUNTER — Other Ambulatory Visit: Payer: Self-pay | Admitting: Obstetrics and Gynecology

## 2017-05-23 ENCOUNTER — Other Ambulatory Visit: Payer: Self-pay | Admitting: Obstetrics and Gynecology

## 2017-05-23 ENCOUNTER — Ambulatory Visit
Admission: RE | Admit: 2017-05-23 | Discharge: 2017-05-23 | Disposition: A | Discharge status: Home / Self Care | Payer: Medicare Other | Source: Ambulatory Visit | Attending: Obstetrics and Gynecology | Admitting: Obstetrics and Gynecology

## 2017-05-23 DIAGNOSIS — M79605 Pain in left leg: Secondary | ICD-10-CM

## 2017-05-23 DIAGNOSIS — M160 Bilateral primary osteoarthritis of hip: Secondary | ICD-10-CM | POA: Diagnosis not present

## 2017-06-14 ENCOUNTER — Other Ambulatory Visit: Payer: Self-pay | Admitting: Student

## 2017-06-14 DIAGNOSIS — M1612 Unilateral primary osteoarthritis, left hip: Secondary | ICD-10-CM

## 2017-06-14 DIAGNOSIS — S76112A Strain of left quadriceps muscle, fascia and tendon, initial encounter: Secondary | ICD-10-CM

## 2017-06-21 ENCOUNTER — Ambulatory Visit
Admission: RE | Admit: 2017-06-21 | Discharge: 2017-06-21 | Disposition: A | Discharge status: Home / Self Care | Payer: Medicare Other | Source: Ambulatory Visit | Attending: Student | Admitting: Student

## 2017-06-21 DIAGNOSIS — S76112A Strain of left quadriceps muscle, fascia and tendon, initial encounter: Secondary | ICD-10-CM | POA: Insufficient documentation

## 2017-06-21 DIAGNOSIS — X58XXXA Exposure to other specified factors, initial encounter: Secondary | ICD-10-CM | POA: Insufficient documentation

## 2017-06-21 DIAGNOSIS — M1612 Unilateral primary osteoarthritis, left hip: Secondary | ICD-10-CM | POA: Insufficient documentation

## 2017-06-21 LAB — POCT I-STAT CREATININE: Creatinine, Ser: 0.8 mg/dL (ref 0.61–1.24)

## 2017-06-21 MED ORDER — GADOBENATE DIMEGLUMINE 529 MG/ML IV SOLN
20.0000 mL | Freq: Once | INTRAVENOUS | Status: AC | PRN
  Administered 2017-06-21: 20 mL via INTRAVENOUS

## 2017-11-14 ENCOUNTER — Other Ambulatory Visit: Payer: Self-pay | Admitting: Obstetrics and Gynecology

## 2017-11-18 ENCOUNTER — Other Ambulatory Visit: Payer: Self-pay | Admitting: Obstetrics and Gynecology

## 2018-02-19 ENCOUNTER — Emergency Department: Payer: Medicare Other

## 2018-02-19 ENCOUNTER — Other Ambulatory Visit: Payer: Self-pay

## 2018-02-19 ENCOUNTER — Encounter: Payer: Self-pay | Admitting: Radiology

## 2018-02-19 ENCOUNTER — Emergency Department
Admission: EM | Admit: 2018-02-19 | Discharge: 2018-02-20 | Disposition: A | Discharge status: Home / Self Care | Payer: Medicare Other | Attending: Emergency Medicine | Admitting: Emergency Medicine

## 2018-02-19 DIAGNOSIS — Z041 Encounter for examination and observation following transport accident: Secondary | ICD-10-CM | POA: Diagnosis not present

## 2018-02-19 DIAGNOSIS — M25531 Pain in right wrist: Secondary | ICD-10-CM | POA: Diagnosis not present

## 2018-02-19 DIAGNOSIS — Z79899 Other long term (current) drug therapy: Secondary | ICD-10-CM | POA: Insufficient documentation

## 2018-02-19 DIAGNOSIS — R51 Headache: Secondary | ICD-10-CM | POA: Diagnosis present

## 2018-02-19 DIAGNOSIS — R0682 Tachypnea, not elsewhere classified: Secondary | ICD-10-CM | POA: Insufficient documentation

## 2018-02-19 DIAGNOSIS — R10819 Abdominal tenderness, unspecified site: Secondary | ICD-10-CM | POA: Diagnosis not present

## 2018-02-19 DIAGNOSIS — E119 Type 2 diabetes mellitus without complications: Secondary | ICD-10-CM | POA: Insufficient documentation

## 2018-02-19 MED ORDER — IOHEXOL 300 MG/ML  SOLN
100.0000 mL | Freq: Once | INTRAMUSCULAR | Status: AC | PRN
  Administered 2018-02-20: 100 mL via INTRAVENOUS

## 2018-02-19 MED ORDER — FENTANYL CITRATE (PF) 100 MCG/2ML IJ SOLN
100.0000 ug | Freq: Once | INTRAMUSCULAR | Status: AC
  Administered 2018-02-19: 100 ug via INTRAVENOUS
  Filled 2018-02-19: qty 2
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
## 2018-02-20 DIAGNOSIS — R51 Headache: Secondary | ICD-10-CM | POA: Diagnosis not present

## 2018-02-20 LAB — CBC WITH DIFFERENTIAL/PLATELET
Abs Immature Granulocytes: 0.04 10*3/uL (ref 0.00–0.07)
Basophils Absolute: 0 10*3/uL (ref 0.0–0.1)
Basophils Relative: 0 %
Eosinophils Absolute: 0 10*3/uL (ref 0.0–0.5)
Eosinophils Relative: 0 %
HCT: 39 % (ref 39.0–52.0)
Hemoglobin: 13.1 g/dL (ref 13.0–17.0)
Immature Granulocytes: 0 %
Lymphocytes Relative: 31 %
Lymphs Abs: 3 10*3/uL (ref 0.7–4.0)
MCH: 30.3 pg (ref 26.0–34.0)
MCHC: 33.6 g/dL (ref 30.0–36.0)
MCV: 90.3 fL (ref 80.0–100.0)
Monocytes Absolute: 0.7 10*3/uL (ref 0.1–1.0)
Monocytes Relative: 7 %
Neutro Abs: 5.9 10*3/uL (ref 1.7–7.7)
Neutrophils Relative %: 62 %
Platelets: 317 10*3/uL (ref 150–400)
RBC: 4.32 MIL/uL (ref 4.22–5.81)
RDW: 12.9 % (ref 11.5–15.5)
WBC: 9.7 10*3/uL (ref 4.0–10.5)
nRBC: 0 % (ref 0.0–0.2)

## 2018-02-20 LAB — LACTIC ACID, PLASMA: Lactic Acid, Venous: 1.8 mmol/L (ref 0.5–1.9)

## 2018-02-20 LAB — COMPREHENSIVE METABOLIC PANEL
ALK PHOS: 75 U/L (ref 38–126)
ALT: 20 U/L (ref 0–44)
AST: 20 U/L (ref 15–41)
Albumin: 4 g/dL (ref 3.5–5.0)
Anion gap: 11 (ref 5–15)
BUN: 12 mg/dL (ref 6–20)
CALCIUM: 9.3 mg/dL (ref 8.9–10.3)
CO2: 26 mmol/L (ref 22–32)
Chloride: 109 mmol/L (ref 98–111)
Creatinine, Ser: 0.87 mg/dL (ref 0.61–1.24)
GFR calc Af Amer: >60 mL/min (ref >60)
GFR calc non Af Amer: >60 mL/min (ref >60)
Glucose, Bld: 148 mg/dL — ABNORMAL HIGH (ref 70–99)
Potassium: 4 mmol/L (ref 3.5–5.1)
Sodium: 146 mmol/L — ABNORMAL HIGH (ref 135–145)
Total Bilirubin: 0.9 mg/dL (ref 0.3–1.2)
Total Protein: 7.4 g/dL (ref 6.5–8.1)

## 2018-02-20 LAB — TYPE AND SCREEN
ABO/RH(D): O POS
ANTIBODY SCREEN: NEGATIVE

## 2018-02-20 MED ORDER — HYDROCODONE-ACETAMINOPHEN 5-325 MG PO TABS: 1.0000 | ORAL_TABLET | Freq: Four times a day (QID) | ORAL | 0 refills | Status: DC | PRN

## 2018-02-20 MED ORDER — IBUPROFEN 600 MG PO TABS: 600.0000 mg | ORAL_TABLET | Freq: Three times a day (TID) | ORAL | 0 refills | Status: DC | PRN

## 2018-02-20 NOTE — ED Notes (Signed)
Patient transported to CT 
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
## 2018-02-20 NOTE — ED Notes (Signed)
Patient verbalized understanding of discharge instructions, no questions. Patient out of ED via wheelchair in no distress.  

## 2018-05-02 ENCOUNTER — Emergency Department
Admission: EM | Admit: 2018-05-02 | Discharge: 2018-05-02 | Disposition: A | Discharge status: Home / Self Care | Payer: Medicare Other | Attending: Emergency Medicine | Admitting: Emergency Medicine

## 2018-05-02 ENCOUNTER — Other Ambulatory Visit: Payer: Self-pay

## 2018-05-02 DIAGNOSIS — N410 Acute prostatitis: Secondary | ICD-10-CM | POA: Diagnosis not present

## 2018-05-02 DIAGNOSIS — Z79899 Other long term (current) drug therapy: Secondary | ICD-10-CM | POA: Insufficient documentation

## 2018-05-02 DIAGNOSIS — E119 Type 2 diabetes mellitus without complications: Secondary | ICD-10-CM | POA: Insufficient documentation

## 2018-05-02 DIAGNOSIS — R350 Frequency of micturition: Secondary | ICD-10-CM | POA: Diagnosis present

## 2018-05-02 LAB — URINALYSIS, COMPLETE (UACMP) WITH MICROSCOPIC
Bacteria, UA: NONE SEEN
Bilirubin Urine: NEGATIVE
Glucose, UA: NEGATIVE mg/dL
Ketones, ur: NEGATIVE mg/dL
Nitrite: NEGATIVE
Protein, ur: 100 mg/dL — AB
RBC / HPF: >50 RBC/hpf — ABNORMAL HIGH (ref 0–5)
Specific Gravity, Urine: 1.019 (ref 1.005–1.030)
WBC, UA: >50 WBC/hpf — ABNORMAL HIGH (ref 0–5)
pH: 5 (ref 5.0–8.0)

## 2018-05-02 MED ORDER — SULFAMETHOXAZOLE-TRIMETHOPRIM 800-160 MG PO TABS
1.0000 | ORAL_TABLET | Freq: Once | ORAL | Status: AC
  Administered 2018-05-02: 1 via ORAL
  Filled 2018-05-02: qty 1

## 2018-05-02 MED ORDER — SULFAMETHOXAZOLE-TRIMETHOPRIM 800-160 MG PO TABS: 1.0000 | ORAL_TABLET | Freq: Two times a day (BID) | ORAL | 0 refills | Status: AC

## 2018-05-02 NOTE — ED Notes (Signed)
Pt called from waiting room 3x with no response

## 2018-05-02 NOTE — ED Triage Notes (Signed)
Pt in with co urinary frequency and urgency. Pt denies any n.v.d or fever.

## 2018-05-02 NOTE — ED Provider Notes (Signed)
[facility] Emergency Department Provider Note  ____________________________________________  Time seen: Approximately 11:04 PM  I have reviewed the triage vital signs and the nursing notes.   HISTORY  Chief Complaint Urinary Frequency    HPI Benjamin Castaneda is a 63 y.o. male who presents the emergency department complaining of urinary frequency, urgency, dysuria.  Patient reports that he has had symptoms for the past 2 to 3 days.  He reports that his urine is cloudy but no frank blood.  Patient denies any flank pain.  No history of nephrolithiasis.  Patient reports that he has pain/burning sensation in the perineal area radiating throughout the penis.  Patient denies any penile drainage.  Patient does not take any medications for his complaint.  No history of issues with his prostate or history of urinary tract infections.  Patient does have a history of diabetes but denies any complications at this time.  No other complaints at this time.   Patient denies any fevers or chills, abdominal pain, suprapubic pain, nausea or vomiting, diarrhea constipation.   Past Medical History:  Diagnosis Date  . Diabetes mellitus without complication (HCC)     There are no active problems to display for this patient.   Past Surgical History:  Procedure Laterality Date  . APPENDECTOMY      Prior to Admission medications   Medication Sig Start Date End Date Taking? Authorizing Provider  albuterol (PROVENTIL HFA;VENTOLIN HFA) 108 (90 Base) MCG/ACT inhaler Inhale 2 puffs into the lungs every 6 (six) hours as needed for wheezing or shortness of breath. 08/20/16   Enid Derry, PA-C  azithromycin (ZITHROMAX Z-PAK) 250 MG tablet Take 2 tablets (500 mg) on  Day 1,  followed by 1 tablet (250 mg) once daily on Days 2 through 5. 08/20/16   Enid Derry, PA-C  cyclobenzaprine (FLEXERIL) 10 MG tablet Take 1 tablet (10 mg total) by mouth 3 (three) times daily as needed. 01/31/16   Joni Reining, PA-C  HYDROcodone-acetaminophen (NORCO) 5-325 MG tablet Take 1 tablet by mouth every 6 (six) hours as needed for up to 7 doses for severe pain. 02/20/18   Merrily Brittle, MD  ibuprofen (ADVIL,MOTRIN) 600 MG tablet Take 1 tablet (600 mg total) by mouth every 8 (eight) hours as needed. 02/20/18   Merrily Brittle, MD  methocarbamol (ROBAXIN) 500 MG tablet Take 1 tablet (500 mg total) by mouth 4 (four) times daily. 10/29/15   River Mckercher, Delorise Royals, PA-C  sulfamethoxazole-trimethoprim (BACTRIM DS,SEPTRA DS) 800-160 MG tablet Take 1 tablet by mouth 2 (two) times daily. 05/02/18 06/13/18  Aldean Pipe, Delorise Royals, PA-C    Allergies Patient has no known allergies.  No family history on file.  Social History Social History   Tobacco Use  . Smoking status: Never Smoker  . Smokeless tobacco: Never Used  Substance Use Topics  . Alcohol use: No  . Drug use: Not on file     Review of Systems  Constitutional: No fever/chills Eyes: No visual changes.  Cardiovascular: no chest pain. Respiratory: no cough. No SOB. Gastrointestinal: No abdominal pain.  No nausea, no vomiting.  No diarrhea.  No constipation. Genitourinary: Positive for dysuria, polyuria, urinary urgency. No hematuria Musculoskeletal: Negative for musculoskeletal pain. Skin: Negative for rash, abrasions, lacerations, ecchymosis. Neurological: Negative for headaches, focal weakness or numbness. 10-point ROS otherwise negative.  ____________________________________________   PHYSICAL EXAM:  VITAL SIGNS: ED Triage Vitals  Enc Vitals Group     BP 05/02/18 1953 (!) 141/91  Pulse Rate 05/02/18 1953 (!) 108     Resp 05/02/18 1953 20     Temp 05/02/18 1953 97.7 F (36.5 C)     Temp Source 05/02/18 1953 Oral     SpO2 05/02/18 1953 97 %     Weight 05/02/18 1954 249 lb 1.9 oz (113 kg)     Height --      Head Circumference --      Peak Flow --      Pain Score 05/02/18 1954 0     Pain Loc --      Pain Edu? --      Excl.  in GC? --      Constitutional: Alert and oriented. Well appearing and in no acute distress. Eyes: Conjunctivae are normal. PERRL. EOMI. Head: Atraumatic.  Cardiovascular: Normal rate, regular rhythm. Normal S1 and S2.  Good peripheral circulation. Respiratory: Normal respiratory effort without tachypnea or retractions. Lungs CTAB. Good air entry to the bases with no decreased or absent breath sounds. Gastrointestinal: Bowel sounds 4 quadrants. Soft and nontender to palpation. No guarding or rigidity. No palpable masses. No distention. No CVA tenderness. Genitourinary: No visible abnormality to the penile shaft, scrotum.  No tenderness to palpation.  Over the penis or scrotum.  Prostate exam reveals mildly boggy prostate with tenderness. Musculoskeletal: Full range of motion to all extremities. No gross deformities appreciated. Neurologic:  Normal speech and language. No gross focal neurologic deficits are appreciated.  Skin:  Skin is warm, dry and intact. No rash noted. Psychiatric: Mood and affect are normal. Speech and behavior are normal. Patient exhibits appropriate insight and judgement.   ____________________________________________   LABS (all labs ordered are listed, but only abnormal results are displayed)  Labs Reviewed  URINALYSIS, COMPLETE (UACMP) WITH MICROSCOPIC - Abnormal; Notable for the following components:      Result Value   Color, Urine YELLOW (*)    APPearance CLOUDY (*)    Hgb urine dipstick LARGE (*)    Protein, ur 100 (*)    Leukocytes,Ua MODERATE (*)    RBC / HPF >50 (*)    WBC, UA >50 (*)    Non Squamous Epithelial PRESENT (*)    All other components within normal limits   ____________________________________________  EKG   ____________________________________________  RADIOLOGY   No results found.  ____________________________________________    PROCEDURES  Procedure(s) performed:    Procedures    Medications   sulfamethoxazole-trimethoprim (BACTRIM DS,SEPTRA DS) 800-160 MG per tablet 1 tablet (1 tablet Oral Given 05/02/18 2330)     ____________________________________________   INITIAL IMPRESSION / ASSESSMENT AND PLAN / ED COURSE  Pertinent labs & imaging results that were available during my care of the patient were reviewed by me and considered in my medical decision making (see chart for details).  Review of the [place] CSRS was performed in accordance of the [facility] prior to dispensing any controlled drugs.      Patient's diagnosis is consistent with prostatitis.  Patient presents emergency department with urinary frequency and urgency.  Patient does have dysuria.  Differential included nephrolithiasis, cystitis, prostatitis.  On exam, patient does have a slightly boggy prostate with pain with palpation.  Patient is not acutely ill and does not require labs or imaging at this time.  Given findings, patient will be started on Bactrim and will be taking Bactrim for the next 6 weeks.  Follow-up with primary care..  Patient is given ED precautions to return to the ED for any worsening or  new symptoms.     ____________________________________________  FINAL CLINICAL IMPRESSION(S) / ED DIAGNOSES  Final diagnoses:  Acute prostatitis      NEW MEDICATIONS STARTED DURING THIS VISIT:  ED Discharge Orders         Ordered    sulfamethoxazole-trimethoprim (BACTRIM DS,SEPTRA DS) 800-160 MG tablet  2 times daily     05/02/18 2313              This chart was dictated using voice recognition software/Dragon. Despite best efforts to proofread, errors can occur which can change the meaning. Any change was purely unintentional.    Racheal PatchesCuthriell, Yukie Bergeron D, PA-C 05/02/18 2335    Sharman CheekStafford, Phillip, MD 05/03/18 0020

## 2018-05-20 ENCOUNTER — Other Ambulatory Visit: Payer: Self-pay | Admitting: Obstetrics and Gynecology

## 2018-05-25 ENCOUNTER — Other Ambulatory Visit: Payer: Self-pay | Admitting: Obstetrics and Gynecology

## 2018-07-05 ENCOUNTER — Inpatient Hospital Stay
Admission: EM | Admit: 2018-07-05 | Discharge: 2018-07-06 | DRG: 179 | Disposition: A | Discharge status: Other Acute Inpatient Hospital | Payer: Medicare Other | Attending: Internal Medicine | Admitting: Internal Medicine

## 2018-07-05 ENCOUNTER — Emergency Department: Payer: Medicare Other

## 2018-07-05 ENCOUNTER — Encounter: Payer: Self-pay | Admitting: *Deleted

## 2018-07-05 ENCOUNTER — Other Ambulatory Visit: Payer: Self-pay

## 2018-07-05 DIAGNOSIS — R05 Cough: Secondary | ICD-10-CM | POA: Diagnosis present

## 2018-07-05 DIAGNOSIS — J988 Other specified respiratory disorders: Secondary | ICD-10-CM

## 2018-07-05 DIAGNOSIS — J069 Acute upper respiratory infection, unspecified: Secondary | ICD-10-CM | POA: Diagnosis present

## 2018-07-05 DIAGNOSIS — R0602 Shortness of breath: Secondary | ICD-10-CM | POA: Diagnosis not present

## 2018-07-05 DIAGNOSIS — E119 Type 2 diabetes mellitus without complications: Secondary | ICD-10-CM | POA: Diagnosis present

## 2018-07-05 LAB — CBC WITH DIFFERENTIAL/PLATELET
Abs Immature Granulocytes: 0.02 10*3/uL (ref 0.00–0.07)
Basophils Absolute: 0 10*3/uL (ref 0.0–0.1)
Basophils Relative: 0 %
Eosinophils Absolute: 0 10*3/uL (ref 0.0–0.5)
Eosinophils Relative: 0 %
HCT: 42 % (ref 39.0–52.0)
Hemoglobin: 13.9 g/dL (ref 13.0–17.0)
Immature Granulocytes: 0 %
Lymphocytes Relative: 23 %
Lymphs Abs: 1.4 10*3/uL (ref 0.7–4.0)
MCH: 29.3 pg (ref 26.0–34.0)
MCHC: 33.1 g/dL (ref 30.0–36.0)
MCV: 88.6 fL (ref 80.0–100.0)
Monocytes Absolute: 0.4 10*3/uL (ref 0.1–1.0)
Monocytes Relative: 6 %
Neutro Abs: 4.3 10*3/uL (ref 1.7–7.7)
Neutrophils Relative %: 71 %
Platelets: 225 10*3/uL (ref 150–400)
RBC: 4.74 MIL/uL (ref 4.22–5.81)
RDW: 12.6 % (ref 11.5–15.5)
WBC: 6.1 10*3/uL (ref 4.0–10.5)
nRBC: 0 % (ref 0.0–0.2)

## 2018-07-05 LAB — COMPREHENSIVE METABOLIC PANEL
ALT: 15 U/L (ref 0–44)
AST: 18 U/L (ref 15–41)
Albumin: 3.7 g/dL (ref 3.5–5.0)
Alkaline Phosphatase: 70 U/L (ref 38–126)
Anion gap: 14 (ref 5–15)
BUN: 12 mg/dL (ref 6–20)
CO2: 19 mmol/L — ABNORMAL LOW (ref 22–32)
Calcium: 8.6 mg/dL — ABNORMAL LOW (ref 8.9–10.3)
Chloride: 102 mmol/L (ref 98–111)
Creatinine, Ser: 0.99 mg/dL (ref 0.61–1.24)
GFR calc Af Amer: >60 mL/min (ref >60)
GFR calc non Af Amer: >60 mL/min (ref >60)
Glucose, Bld: 364 mg/dL — ABNORMAL HIGH (ref 70–99)
Potassium: 3.7 mmol/L (ref 3.5–5.1)
Sodium: 135 mmol/L (ref 135–145)
Total Bilirubin: 1.4 mg/dL — ABNORMAL HIGH (ref 0.3–1.2)
Total Protein: 8.1 g/dL (ref 6.5–8.1)

## 2018-07-05 LAB — BRAIN NATRIURETIC PEPTIDE: B Natriuretic Peptide: 42 pg/mL (ref 0.0–100.0)

## 2018-07-05 LAB — TROPONIN I: Troponin I: <0.03 ng/mL (ref <0.03)

## 2018-07-05 LAB — LACTIC ACID, PLASMA: Lactic Acid, Venous: 1.1 mmol/L (ref 0.5–1.9)

## 2018-07-05 LAB — ETHANOL: Alcohol, Ethyl (B): <10 mg/dL (ref <10)

## 2018-07-05 MED ORDER — SODIUM CHLORIDE 0.9 % IV SOLN
1.0000 g | Freq: Once | INTRAVENOUS | Status: AC
  Administered 2018-07-05: 1 g via INTRAVENOUS
  Filled 2018-07-05: qty 10

## 2018-07-05 MED ORDER — SODIUM CHLORIDE 0.9 % IV SOLN
500.0000 mg | Freq: Once | INTRAVENOUS | Status: AC
  Administered 2018-07-05: 500 mg via INTRAVENOUS
  Filled 2018-07-05: qty 500

## 2018-07-05 MED ORDER — IOHEXOL 240 MG/ML SOLN
50.0000 mL | Freq: Once | INTRAMUSCULAR | Status: AC | PRN
  Administered 2018-07-06: 50 mL via ORAL
  Filled 2018-07-05: qty 50

## 2018-07-05 NOTE — ED Triage Notes (Signed)
Pt c/o shortness of breath x 2 weeks that he said is getting worse. Pt's wife is in ED presently w/ different complaint. Pt epigastric pain. Pt's abdomen is distended, soft, tender to palpation over epigastrum.Pt c/o weakness. Pt states he has had episodes of dizziness today.

## 2018-07-05 NOTE — ED Provider Notes (Signed)
[facility] Emergency Department Provider Note  ____________________________________________   I have reviewed the triage vital signs and the nursing notes. Where available I have reviewed prior notes and, if possible and indicated, outside hospital notes.    HISTORY  Chief Complaint Shortness of Breath    HPI Benjamin Castaneda is a 63 y.o. male  who presents today complaining of multiple different things.  He states he has been having a cough off and on for "a little while".  No fever.  He is also complaining of epigastric abdominal pain which is very poorly described.  Not chest pain he is very specific is in the epigastric abdominal region, he also feels short of breath when he walks around.  He has had no leg swelling, he is very very vague about his symptoms.  Level 5 chart caveat; no further history available due to patient status.    Past Medical History:  Diagnosis Date  . Diabetes mellitus without complication (HCC)     There are no active problems to display for this patient.   Past Surgical History:  Procedure Laterality Date  . APPENDECTOMY      Prior to Admission medications   Medication Sig Start Date End Date Taking? Authorizing Provider  albuterol (PROVENTIL HFA;VENTOLIN HFA) 108 (90 Base) MCG/ACT inhaler Inhale 2 puffs into the lungs every 6 (six) hours as needed for wheezing or shortness of breath. 08/20/16   Enid Derry, PA-C  azithromycin (ZITHROMAX Z-PAK) 250 MG tablet Take 2 tablets (500 mg) on  Day 1,  followed by 1 tablet (250 mg) once daily on Days 2 through 5. 08/20/16   Enid Derry, PA-C  cyclobenzaprine (FLEXERIL) 10 MG tablet Take 1 tablet (10 mg total) by mouth 3 (three) times daily as needed. 01/31/16   Joni Reining, PA-C  HYDROcodone-acetaminophen (NORCO) 5-325 MG tablet Take 1 tablet by mouth every 6 (six) hours as needed for up to 7 doses for severe pain. 02/20/18   Merrily Brittle, MD  ibuprofen (ADVIL,MOTRIN) 600 MG  tablet Take 1 tablet (600 mg total) by mouth every 8 (eight) hours as needed. 02/20/18   Merrily Brittle, MD  methocarbamol (ROBAXIN) 500 MG tablet Take 1 tablet (500 mg total) by mouth 4 (four) times daily. 10/29/15   Cuthriell, Delorise Royals, PA-C    Allergies Patient has no known allergies.  History reviewed. No pertinent family history.  Social History Social History   Tobacco Use  . Smoking status: Never Smoker  . Smokeless tobacco: Never Used  Substance Use Topics  . Alcohol use: No  . Drug use: Not on file    Review of Systems Constitutional: No fever/chills Eyes: No visual changes. ENT: No sore throat. No stiff neck no neck pain Cardiovascular: Denies chest pain. Respiratory: + shortness of breath. Gastrointestinal:   no vomiting.  No diarrhea.  No constipation. Genitourinary: Negative for dysuria. Musculoskeletal: Negative lower extremity swelling Skin: Negative for rash. Neurological: Negative for severe headaches, focal weakness or numbness.   ____________________________________________   PHYSICAL EXAM:  VITAL SIGNS: ED Triage Vitals  Enc Vitals Group     BP 07/05/18 2157 (!) 167/79     Pulse Rate 07/05/18 2157 (!) 101     Resp 07/05/18 2157 19     Temp 07/05/18 2157 99.5 F (37.5 C)     Temp Source 07/05/18 2157 Oral     SpO2 07/05/18 2157 95 %     Weight 07/05/18 2201 249 lb 1.9 oz (113 kg)  Height 07/05/18 2201 6\' 2"  (1.88 m)     Head Circumference --      Peak Flow --      Pain Score 07/05/18 2159 9     Pain Loc --      Pain Edu? --      Excl. in GC? --     Constitutional: Alert and oriented in place and date just a poor historian. Well appearing and in no acute distress. Eyes: Conjunctivae are normal Head: Atraumatic HEENT: No congestion/rhinnorhea. Mucous membranes are moist.  Oropharynx non-erythematous Neck:   Nontender with no meningismus, no masses, no stridor Cardiovascular: Normal rate, regular rhythm. Grossly normal heart sounds.   Good peripheral circulation. Respiratory: Normal respiratory effort.  No retractions.  Diminished in the bases, no rales or rhonchi. Abdominal: Some mild distention positive bowel sounds, questionable ascites, tenderness to palpation in the epigastric region no guarding or rebound. No distention. No guarding no rebound Back:  There is no focal tenderness or step off.  there is no midline tenderness there are no lesions noted. there is no CVA tenderness Musculoskeletal: No lower extremity tenderness, no upper extremity tenderness. No joint effusions, no DVT signs strong distal pulses no edema Neurologic:  Normal speech and language. No gross focal neurologic deficits are appreciated.  Skin:  Skin is warm, dry and intact. No rash noted. Psychiatric: Mood and affect are normal. Speech and behavior are normal.  ____________________________________________   LABS (all labs ordered are listed, but only abnormal results are displayed)  Labs Reviewed  CULTURE, BLOOD (ROUTINE X 2)  CULTURE, BLOOD (ROUTINE X 2)  SARS CORONAVIRUS 2 (HOSPITAL ORDER, PERFORMED IN [facility])  COMPREHENSIVE METABOLIC PANEL  CBC WITH DIFFERENTIAL/PLATELET  ETHANOL  TROPONIN I  BRAIN NATRIURETIC PEPTIDE  URINALYSIS, COMPLETE (UACMP) WITH MICROSCOPIC  PROCALCITONIN  CBG MONITORING, ED    Pertinent labs  results that were available during my care of the patient were reviewed by me and considered in my medical decision making (see chart for details). ____________________________________________  EKG  I personally interpreted any EKGs ordered by me or triage  ____________________________________________  RADIOLOGY  Pertinent labs & imaging results that were available during my care of the patient were reviewed by me and considered in my medical decision making (see chart for details). If possible, patient and/or family made aware of any abnormal findings.  Dg Chest Portable 1 View  Result Date:  07/05/2018 CLINICAL DATA:  63 y/o  M; 2 weeks of worsening shortness of breath. EXAM: PORTABLE CHEST 1 VIEW COMPARISON:  02/20/2018 CT chest FINDINGS: Normal cardiac silhouette given projection and technique. Left mid and lower lung zone infiltrate. No pleural effusion or pneumothorax. Bones are unremarkable. IMPRESSION: Left mid and lower lung zone infiltrate. Electronically Signed   By: Mitzi Hansen M.D.   On: 07/05/2018 22:49   ____________________________________________    PROCEDURES  Procedure(s) performed: None  Procedures  Critical Care performed: None  ____________________________________________   INITIAL IMPRESSION / ASSESSMENT AND PLAN / ED COURSE  Pertinent labs & imaging results that were available during my care of the patient were reviewed by me and considered in my medical decision making (see chart for details).  Pt with cough and + infiltrate on cxr.  Will treat with antibiotics, blood work is pending, given his abdominal pain also obtain CT scan to further evaluate given pandemic we will send COVID testing, patient will be admitted to the hospitalist service.   ----------------------------------------- 11:24 PM on 07/05/2018 -----------------------------------------  Signed out to dr. Manson PasseyBrown at this time     ____________________________________________   FINAL CLINICAL IMPRESSION(S) / ED DIAGNOSES  Final diagnoses:  None      This chart was dictated using voice recognition software.  Despite best efforts to proofread,  errors can occur which can change meaning.      Jeanmarie PlantMcShane, Joran A, MD 07/05/18 2326

## 2018-07-06 ENCOUNTER — Emergency Department: Payer: Medicare Other

## 2018-07-06 ENCOUNTER — Inpatient Hospital Stay (HOSPITAL_COMMUNITY)
Admission: AD | Admit: 2018-07-06 | Discharge: 2018-07-11 | DRG: 177 | Disposition: A | Discharge status: Home Health | Payer: Medicare Other | Source: Other Acute Inpatient Hospital | Attending: Internal Medicine | Admitting: Internal Medicine

## 2018-07-06 ENCOUNTER — Encounter: Payer: Self-pay | Admitting: Radiology

## 2018-07-06 ENCOUNTER — Encounter (HOSPITAL_COMMUNITY): Payer: Self-pay | Admitting: Internal Medicine

## 2018-07-06 DIAGNOSIS — J069 Acute upper respiratory infection, unspecified: Secondary | ICD-10-CM | POA: Diagnosis not present

## 2018-07-06 DIAGNOSIS — E669 Obesity, unspecified: Secondary | ICD-10-CM | POA: Diagnosis not present

## 2018-07-06 DIAGNOSIS — D649 Anemia, unspecified: Secondary | ICD-10-CM | POA: Diagnosis present

## 2018-07-06 DIAGNOSIS — E1165 Type 2 diabetes mellitus with hyperglycemia: Secondary | ICD-10-CM | POA: Diagnosis present

## 2018-07-06 DIAGNOSIS — J1289 Other viral pneumonia: Secondary | ICD-10-CM | POA: Diagnosis present

## 2018-07-06 DIAGNOSIS — E876 Hypokalemia: Secondary | ICD-10-CM | POA: Diagnosis not present

## 2018-07-06 DIAGNOSIS — R05 Cough: Secondary | ICD-10-CM | POA: Diagnosis present

## 2018-07-06 DIAGNOSIS — N400 Enlarged prostate without lower urinary tract symptoms: Secondary | ICD-10-CM | POA: Diagnosis present

## 2018-07-06 DIAGNOSIS — Z7984 Long term (current) use of oral hypoglycemic drugs: Secondary | ICD-10-CM | POA: Diagnosis not present

## 2018-07-06 DIAGNOSIS — I445 Left posterior fascicular block: Secondary | ICD-10-CM | POA: Diagnosis present

## 2018-07-06 DIAGNOSIS — K8020 Calculus of gallbladder without cholecystitis without obstruction: Secondary | ICD-10-CM | POA: Diagnosis present

## 2018-07-06 DIAGNOSIS — E119 Type 2 diabetes mellitus without complications: Secondary | ICD-10-CM

## 2018-07-06 DIAGNOSIS — J9601 Acute respiratory failure with hypoxia: Secondary | ICD-10-CM | POA: Diagnosis present

## 2018-07-06 DIAGNOSIS — G8929 Other chronic pain: Secondary | ICD-10-CM | POA: Diagnosis present

## 2018-07-06 DIAGNOSIS — Z79899 Other long term (current) drug therapy: Secondary | ICD-10-CM

## 2018-07-06 DIAGNOSIS — Z6831 Body mass index (BMI) 31.0-31.9, adult: Secondary | ICD-10-CM

## 2018-07-06 DIAGNOSIS — K432 Incisional hernia without obstruction or gangrene: Secondary | ICD-10-CM | POA: Diagnosis present

## 2018-07-06 DIAGNOSIS — R0602 Shortness of breath: Secondary | ICD-10-CM | POA: Diagnosis present

## 2018-07-06 HISTORY — DX: Other chronic pain: G89.29

## 2018-07-06 HISTORY — DX: Type 2 diabetes mellitus without complications: E11.9

## 2018-07-06 LAB — GLUCOSE, CAPILLARY
Glucose-Capillary: 213 mg/dL — ABNORMAL HIGH (ref 70–99)
Glucose-Capillary: 219 mg/dL — ABNORMAL HIGH (ref 70–99)
Glucose-Capillary: 334 mg/dL — ABNORMAL HIGH (ref 70–99)
Glucose-Capillary: 249 mg/dL — ABNORMAL HIGH (ref 70–99)

## 2018-07-06 LAB — URINALYSIS, COMPLETE (UACMP) WITH MICROSCOPIC
Bacteria, UA: NONE SEEN
Bilirubin Urine: NEGATIVE
Glucose, UA: >=500 mg/dL — AB
Ketones, ur: 80 mg/dL — AB
Leukocytes,Ua: NEGATIVE
Nitrite: NEGATIVE
Protein, ur: NEGATIVE mg/dL
Specific Gravity, Urine: 1.025 (ref 1.005–1.030)
Squamous Epithelial / LPF: NONE SEEN (ref 0–5)
pH: 5 (ref 5.0–8.0)

## 2018-07-06 LAB — LACTATE DEHYDROGENASE: LDH: 173 U/L (ref 98–192)

## 2018-07-06 LAB — PROCALCITONIN: Procalcitonin: <0.1 ng/mL

## 2018-07-06 LAB — CBC
HCT: 38.7 % — ABNORMAL LOW (ref 39.0–52.0)
Hemoglobin: 13.1 g/dL (ref 13.0–17.0)
MCH: 30.2 pg (ref 26.0–34.0)
MCHC: 33.9 g/dL (ref 30.0–36.0)
MCV: 89.2 fL (ref 80.0–100.0)
Platelets: 197 10*3/uL (ref 150–400)
RBC: 4.34 MIL/uL (ref 4.22–5.81)
RDW: 12.7 % (ref 11.5–15.5)
WBC: 5.8 10*3/uL (ref 4.0–10.5)
nRBC: 0 % (ref 0.0–0.2)

## 2018-07-06 LAB — SARS CORONAVIRUS 2 BY RT PCR (HOSPITAL ORDER, PERFORMED IN ~~LOC~~ HOSPITAL LAB): SARS Coronavirus 2: POSITIVE — AB

## 2018-07-06 LAB — D-DIMER, QUANTITATIVE: D-Dimer, Quant: 1.09 ug/mL-FEU — ABNORMAL HIGH (ref 0.00–0.50)

## 2018-07-06 MED ORDER — SENNOSIDES-DOCUSATE SODIUM 8.6-50 MG PO TABS: 1.0000 | ORAL_TABLET | Freq: Every evening | ORAL | Status: DC | PRN

## 2018-07-06 MED ORDER — INSULIN ASPART 100 UNIT/ML ~~LOC~~ SOLN
0.0000 [IU] | Freq: Three times a day (TID) | SUBCUTANEOUS | Status: DC
  Administered 2018-07-06: 7 [IU] via SUBCUTANEOUS
  Administered 2018-07-06 (×2): 3 [IU] via SUBCUTANEOUS

## 2018-07-06 MED ORDER — INSULIN ASPART 100 UNIT/ML ~~LOC~~ SOLN
8.0000 [IU] | Freq: Once | SUBCUTANEOUS | Status: AC
  Administered 2018-07-06: 02:00:00 8 [IU] via SUBCUTANEOUS

## 2018-07-06 MED ORDER — ACETAMINOPHEN 325 MG PO TABS
650.0000 mg | ORAL_TABLET | Freq: Four times a day (QID) | ORAL | Status: DC | PRN
  Administered 2018-07-06 – 2018-07-07 (×2): 650 mg via ORAL
  Filled 2018-07-06 (×2): qty 2

## 2018-07-06 MED ORDER — IOHEXOL 300 MG/ML  SOLN
125.0000 mL | Freq: Once | INTRAMUSCULAR | Status: AC | PRN
  Administered 2018-07-06: 125 mL via INTRAVENOUS
  Filled 2018-07-06: qty 125

## 2018-07-06 MED ORDER — SODIUM CHLORIDE 0.9 % IV BOLUS
1000.0000 mL | Freq: Once | INTRAVENOUS | Status: AC
  Administered 2018-07-06: 1000 mL via INTRAVENOUS

## 2018-07-06 MED ORDER — ENOXAPARIN SODIUM 40 MG/0.4ML ~~LOC~~ SOLN
40.0000 mg | SUBCUTANEOUS | Status: DC
  Administered 2018-07-06 – 2018-07-11 (×6): 40 mg via SUBCUTANEOUS
  Filled 2018-07-06 (×6): qty 0.4

## 2018-07-06 MED ORDER — SODIUM CHLORIDE 0.9% FLUSH: 3.0000 mL | INTRAVENOUS | Status: DC | PRN

## 2018-07-06 MED ORDER — TRAMADOL HCL 50 MG PO TABS: 50.0000 mg | ORAL_TABLET | Freq: Four times a day (QID) | ORAL | Status: DC | PRN

## 2018-07-06 MED ORDER — TAMSULOSIN HCL 0.4 MG PO CAPS
0.4000 mg | ORAL_CAPSULE | Freq: Every day | ORAL | Status: DC
  Administered 2018-07-06 – 2018-07-11 (×6): 0.4 mg via ORAL
  Filled 2018-07-06 (×7): qty 1

## 2018-07-06 MED ORDER — METFORMIN HCL 500 MG PO TABS: 1000.0000 mg | ORAL_TABLET | Freq: Once | ORAL | Status: DC

## 2018-07-06 MED ORDER — ACETAMINOPHEN 650 MG RE SUPP: 650.0000 mg | Freq: Four times a day (QID) | RECTAL | Status: DC | PRN

## 2018-07-06 MED ORDER — SODIUM CHLORIDE 0.9 % IV SOLN: 250.0000 mL | INTRAVENOUS | Status: DC | PRN

## 2018-07-06 MED ORDER — SODIUM CHLORIDE 0.9% FLUSH
3.0000 mL | Freq: Two times a day (BID) | INTRAVENOUS | Status: DC
  Administered 2018-07-06 – 2018-07-10 (×10): 3 mL via INTRAVENOUS

## 2018-07-06 MED ORDER — INSULIN ASPART 100 UNIT/ML ~~LOC~~ SOLN
SUBCUTANEOUS | Status: AC
  Administered 2018-07-06: 02:00:00 8 [IU] via SUBCUTANEOUS
  Filled 2018-07-06: qty 1

## 2018-07-06 MED ORDER — INSULIN ASPART 100 UNIT/ML ~~LOC~~ SOLN
0.0000 [IU] | Freq: Every day | SUBCUTANEOUS | Status: DC
  Administered 2018-07-06: 2 [IU] via SUBCUTANEOUS

## 2018-07-06 MED ORDER — LORAZEPAM 0.5 MG PO TABS: 0.5000 mg | ORAL_TABLET | Freq: Once | ORAL | Status: DC

## 2018-07-06 MED ORDER — ONDANSETRON HCL 4 MG PO TABS: 4.0000 mg | ORAL_TABLET | Freq: Four times a day (QID) | ORAL | Status: DC | PRN

## 2018-07-06 MED ORDER — ONDANSETRON HCL 4 MG/2ML IJ SOLN: 4.0000 mg | Freq: Four times a day (QID) | INTRAMUSCULAR | Status: DC | PRN

## 2018-07-06 NOTE — Progress Notes (Signed)
PROGRESS NOTE  Brief Narrative: Benjamin Castaneda is a 63 y.o. male with a history of T2DM and BPH who presented to [facility] ED with dyspnea, found to have low grade fever, hyperglycemia, bibasilar infiltrates on CXR and covid-19 positive. He is a vague historian and complained of abdominal pain. CT abd/pelvis showed no intraabdominal pathology, but there were GGOs with bibasilar predominance, areas of subpleural sparing, and intralobular septal thickening. He was transferred to [facility] and admitted this morning by Dr. Clearnce Sorrel.   Subjective: Still short of breath, rates it at a 7/10 worse with exertion. Has left lower chest, LUQ abd pain that is worse with deep breaths, not with exertion. He is a difficult historian. Will not definitively give me a timeline of his symptom onset or progression.   Objective: BP 135/83   Pulse 80   Temp 100 F (37.8 C) (Oral)   Resp 19   SpO2 97%   Gen: Nontoxic Pulm: Nonlabored on 2L, scant scattered rhonchi.  CV: RRR, no murmur, no JVD, no edema GI: Soft, lower midline incisional hernia nontender, left chest wall tender to palpation.  Neuro: Alert and oriented. No focal deficits. Skin: No rashes, lesions or ulcers  Assessment & Plan: Acute hypoxic respiratory failure due to covid-19 viral pneumonia: WOB objectively is mild but has new oxygen requirement.  - Wean O2 as tolerated, d/w RN - Continue azithromycin. Low threshold to give HCQ or tocilizumab if decompensation occurs. - Continue negative pressure room, airborne/contact precautions  NIDT2DM with hyperglycemia: Suspect higher CBGs in infection. - SSI  BPH:  - Continue flomax  [name], MD Pager [phone] 07/06/2018, 12:44 PM

## 2018-07-06 NOTE — Significant Event (Signed)
62yo with PMH relevant for T2DM on oral hypoglycemics, chronic pain admitted from [facility] ER with SOB and found to have bilateral infiltrates, fever and novel coronavirus +. Pt is on [facility]. Labs reassuring other than mild hyperglycemia. Pt admitted to [facility] progressive Care.

## 2018-07-06 NOTE — ED Notes (Signed)
ED TO INPATIENT HANDOFF REPORT  ED Nurse Name and Phone #: Danelle Earthly [phone]  S Name/Age/Gender Benjamin Castaneda 63 y.o. male Room/Bed: ED32A/ED32A  Code Status   Code Status: Not on file  Home/SNF/Other Home Patient oriented to: self, place, time and situation Is this baseline? Yes   Triage Complete: Triage complete  Chief Complaint SOb  Triage Note Pt c/o shortness of breath x 2 weeks that he said is getting worse. Pt's wife is in ED presently w/ different complaint. Pt epigastric pain. Pt's abdomen is distended, soft, tender to palpation over epigastrum.Pt c/o weakness. Pt states he has had episodes of dizziness today.   Allergies No Known Allergies  Level of Care/Admitting Diagnosis ED Disposition    ED Disposition Condition Comment   Admit  Hospital Area: [facility] [[id number]]  Level of Care: Progressive [102]  Covid Evaluation: Confirmed COVID Positive  Isolation Risk Level: Low Risk  (Less than 4L Burke supplementation)  Diagnosis: Acute respiratory disease due to COVID-19 virus [[id number]]  Admitting Physician: Delaine Lame [[id number]]  Attending Physician: Delaine Lame [[id number]]  Estimated length of stay: past midnight tomorrow  Certification:: I certify this patient will need inpatient services for at least 2 midnights  PT Class (Do Not Modify): Inpatient [101]  PT Acc Code (Do Not Modify): Private [1]       B Medical/Surgery History Past Medical History:  Diagnosis Date  . Diabetes mellitus without complication [facility])    Past Surgical History:  Procedure Laterality Date  . APPENDECTOMY       A IV Location/Drains/Wounds Patient Lines/Drains/Airways Status   Active Line/Drains/Airways    Name:   Placement date:   Placement time:   Site:   Days:   Peripheral IV 07/05/18 Right Antecubital   07/05/18    2300    Antecubital   1          Intake/Output Last 24 hours  Intake/Output Summary (Last 24 hours) at 07/06/2018 0321 Last  data filed at 07/06/2018 0309 Gross per 24 hour  Intake 1350 ml  Output -  Net 1350 ml    Labs/Imaging Results for orders placed or performed during the hospital encounter of 07/05/18 (from the past 48 hour(s))  Comprehensive metabolic panel     Status: Abnormal   Collection Time: 07/05/18 11:12 PM  Result Value Ref Range   Sodium 135 135 - 145 mmol/L   Potassium 3.7 3.5 - 5.1 mmol/L   Chloride 102 98 - 111 mmol/L   CO2 19 (L) 22 - 32 mmol/L   Glucose, Bld 364 (H) 70 - 99 mg/dL   BUN 12 6 - 20 mg/dL   Creatinine, Ser 7.84 0.61 - 1.24 mg/dL   Calcium 8.6 (L) 8.9 - 10.3 mg/dL   Total Protein 8.1 6.5 - 8.1 g/dL   Albumin 3.7 3.5 - 5.0 g/dL   AST 18 15 - 41 U/L   ALT 15 0 - 44 U/L   Alkaline Phosphatase 70 38 - 126 U/L   Total Bilirubin 1.4 (H) 0.3 - 1.2 mg/dL   GFR calc non Af Amer >60 >60 mL/min   GFR calc Af Amer >60 >60 mL/min   Anion gap 14 5 - 15    Comment: Performed at [facility], [address] [place], [place] [address]  CBC with Differential     Status: None   Collection Time: 07/05/18 11:12 PM  Result Value Ref Range   WBC 6.1 4.0 - 10.5  K/uL   RBC 4.74 4.22 - 5.81 MIL/uL   Hemoglobin 13.9 13.0 - 17.0 g/dL   HCT 03.5 59.7 - 41.6 %   MCV 88.6 80.0 - 100.0 fL   MCH 29.3 26.0 - 34.0 pg   MCHC 33.1 30.0 - 36.0 g/dL   RDW 38.4 53.6 - 46.8 %   Platelets 225 150 - 400 K/uL   nRBC 0.0 0.0 - 0.2 %   Neutrophils Relative % 71 %   Neutro Abs 4.3 1.7 - 7.7 K/uL   Lymphocytes Relative 23 %   Lymphs Abs 1.4 0.7 - 4.0 K/uL   Monocytes Relative 6 %   Monocytes Absolute 0.4 0.1 - 1.0 K/uL   Eosinophils Relative 0 %   Eosinophils Absolute 0.0 0.0 - 0.5 K/uL   Basophils Relative 0 %   Basophils Absolute 0.0 0.0 - 0.1 K/uL   Immature Granulocytes 0 %   Abs Immature Granulocytes 0.02 0.00 - 0.07 K/uL    Comment: Performed at [facility], [address] [place], [place] [address]  Ethanol     Status: None   Collection Time: 07/05/18 11:12 PM  Result  Value Ref Range   Alcohol, Ethyl (B) <10 <10 mg/dL    Comment: (NOTE) Lowest detectable limit for serum alcohol is 10 mg/dL. For medical purposes only. Performed at [facility], [address] [place], [place] [address]   Troponin I - Once     Status: None   Collection Time: 07/05/18 11:12 PM  Result Value Ref Range   Troponin I <0.03 <0.03 ng/mL    Comment: Performed at [facility], [address] [place], [place] [address]  Brain natriuretic peptide     Status: None   Collection Time: 07/05/18 11:12 PM  Result Value Ref Range   B Natriuretic Peptide 42.0 0.0 - 100.0 pg/mL    Comment: Performed at [facility], [address] [place], [place] [address]  Urinalysis, Complete w Microscopic     Status: Abnormal   Collection Time: 07/05/18 11:12 PM  Result Value Ref Range   Color, Urine YELLOW (A) YELLOW   APPearance CLEAR (A) CLEAR   Specific Gravity, Urine 1.025 1.005 - 1.030   pH 5.0 5.0 - 8.0   Glucose, UA >=500 (A) NEGATIVE mg/dL   Hgb urine dipstick SMALL (A) NEGATIVE   Bilirubin Urine NEGATIVE NEGATIVE   Ketones, ur 80 (A) NEGATIVE mg/dL   Protein, ur NEGATIVE NEGATIVE mg/dL   Nitrite NEGATIVE NEGATIVE   Leukocytes,Ua NEGATIVE NEGATIVE   RBC / HPF 0-5 0 - 5 RBC/hpf   WBC, UA 0-5 0 - 5 WBC/hpf   Bacteria, UA NONE SEEN NONE SEEN   Squamous Epithelial / LPF NONE SEEN 0 - 5   Mucus PRESENT     Comment: Performed at [facility], [address] [place], [place] [address]  Procalcitonin     Status: None   Collection Time: 07/05/18 11:12 PM  Result Value Ref Range   Procalcitonin <0.10 ng/mL    Comment:        Interpretation: PCT (Procalcitonin) <= 0.5 ng/mL: Systemic infection (sepsis) is not likely. Local bacterial infection is possible. (NOTE)       Sepsis PCT Algorithm           Lower Respiratory Tract  Infection PCT Algorithm    ----------------------------      ----------------------------         PCT < 0.25 ng/mL                PCT < 0.10 ng/mL         Strongly encourage             Strongly discourage   discontinuation of antibiotics    initiation of antibiotics    ----------------------------     -----------------------------       PCT 0.25 - 0.50 ng/mL            PCT 0.10 - 0.25 ng/mL               OR       >80% decrease in PCT            Discourage initiation of                                            antibiotics      Encourage discontinuation           of antibiotics    ----------------------------     -----------------------------         PCT >= 0.50 ng/mL              PCT 0.26 - 0.50 ng/mL               AND        <80% decrease in PCT             Encourage initiation of                                             antibiotics       Encourage continuation           of antibiotics    ----------------------------     -----------------------------        PCT >= 0.50 ng/mL                  PCT > 0.50 ng/mL               AND         increase in PCT                  Strongly encourage                                      initiation of antibiotics    Strongly encourage escalation           of antibiotics                                     -----------------------------                                           PCT <= 0.25 ng/mL  OR                                        > 80% decrease in PCT                                     Discontinue / Do not initiate                                             antibiotics Performed at [facility], [address] [place], [place] [address]   SARS Coronavirus 2 [facility] order, Performed in [facility] hospital lab)     Status: Abnormal   Collection Time: 07/05/18 11:12 PM  Result Value Ref Range   SARS Coronavirus 2 POSITIVE (A) NEGATIVE    Comment: RESULT CALLED TO, READ BACK BY AND VERIFIED WITH: Chyla Schlender WEBSTER AT 0026 07/06/2018.   TFK (NOTE) If result is NEGATIVE SARS-CoV-2 target nucleic acids are NOT DETECTED. The SARS-CoV-2 RNA is generally detectable in upper and lower  respiratory specimens during the acute phase of infection. The lowest  concentration of SARS-CoV-2 viral copies this assay can detect is 250  copies / mL. A negative result does not preclude SARS-CoV-2 infection  and should not be used as the sole basis for treatment or other  patient management decisions.  A negative result may occur with  improper specimen collection / handling, submission of specimen other  than nasopharyngeal swab, presence of viral mutation(s) within the  areas targeted by this assay, and inadequate number of viral copies  (<250 copies / mL). A negative result must be combined with clinical  observations, patient history, and epidemiological information. If result is POSITIVE SARS-CoV-2 target nucleic acids are DETECTED.  The SARS-CoV-2 RNA is generally detectable in upper and lower  respiratory specimens during the acute phase of infection.  Positive  results are indicative of active infection with SARS-CoV-2.  Clinical  correlation with patient history and other diagnostic information is  necessary to determine patient infection status.  Positive results do  not rule out bacterial infection or co-infection with other viruses. If result is PRESUMPTIVE POSTIVE SARS-CoV-2 nucleic acids MAY BE PRESENT.   A presumptive positive result was obtained on the submitted specimen  and confirmed on repeat testing.  While 2019 novel coronavirus  (SARS-CoV-2) nucleic acids may be present in the submitted sample  additional confirmatory testing may be necessary for epidemiological  and / or clinical management purposes  to differentiate between  SARS-CoV-2 and other Sarbecovirus currently known to infect humans.  If clinically indicated additional testing with an alternate test  methodology [id number]  is advised. The SARS-CoV-2 RNA  is generally  detectable in upper and lower respiratory specimens during the acute  phase of infection. The expected result is Negative. Fact Sheet for Patients:  BoilerBrush.com.cy Fact Sheet for Healthcare Providers: https://pope.com/ This test is not yet approved or cleared by the [place] FDA and has been authorized for detection and/or diagnosis of SARS-CoV-2 by FDA under an Emergency Use Authorization (EUA).  This EUA will remain in effect (meaning this test can be used) for the duration of the COVID-19 declaration under Section 564(b)(1) of the Act, 21 U.S.C.  section 360bbb-3(b)(1), unless the authorization is terminated or revoked sooner. Performed at [facility], [address] [place], [place] [address]   Lactic acid, plasma     Status: None   Collection Time: 07/05/18 11:12 PM  Result Value Ref Range   Lactic Acid, Venous 1.1 0.5 - 1.9 mmol/L    Comment: Performed at [facility], [address] [place], [place] [address]   Ct Chest W Contrast  Result Date: 07/06/2018 CLINICAL DATA:  63 y/o  M; epigastric abdominal pain and cough. EXAM: CT CHEST, ABDOMEN, AND PELVIS WITH CONTRAST TECHNIQUE: Multidetector CT imaging of the chest, abdomen and pelvis was performed following the standard protocol during bolus administration of intravenous contrast. CONTRAST:  OMNIPAQUE IOHEXOL 300 MG/ML  SOLN COMPARISON:  02/21/2019 CT of chest, abdomen, pelvis. FINDINGS: CT CHEST FINDINGS Cardiovascular: No significant vascular findings. Normal heart size. No pericardial effusion. Mediastinum/Nodes: No enlarged mediastinal, hilar, or axillary lymph nodes. Thyroid gland, trachea, and esophagus demonstrate no significant findings. Lungs/Pleura: Ground-glass opacities with peripheral basilar predominance, greater in the left lung with with areas of subpleural sparing and intralobular septal thickening. No consolidation,  effusion, or pneumothorax. Musculoskeletal: No chest wall mass or suspicious bone lesions identified. CT ABDOMEN PELVIS FINDINGS Hepatobiliary: No focal liver abnormality is seen. Cholelithiasis. No gallbladder wall thickening or biliary ductal dilatation. Pancreas: Unremarkable. No pancreatic ductal dilatation or surrounding inflammatory changes. Spleen: Normal in size without focal abnormality. Adrenals/Urinary Tract: Adrenal glands are unremarkable. Kidneys are normal, without renal calculi, focal lesion, or hydronephrosis. Bladder is unremarkable. Stomach/Bowel: Stomach is within normal limits. Appendix not identified, no pericecal inflammation. No evidence of bowel wall thickening, distention, or inflammatory changes. Vascular/Lymphatic: Aortic atherosclerosis. No enlarged abdominal or pelvic lymph nodes. Reproductive: Prostate is unremarkable. Other: No abdominal wall hernia or abnormality. No abdominopelvic ascites. Musculoskeletal: No fracture is seen. IMPRESSION: 1. There are a spectrum of findings in the lungs which can be seen with acute atypical infection (as well as other non-infectious etiologies). In particular, viral pneumonia (including COVID-19) should be considered in the appropriate clinical setting. 2. Aortic atherosclerosis. 3. Cholelithiasis. Critical Value/emergent results were called by telephone at the time of interpretation on 07/06/2018 at 1:43 am to Dr. Manson Passey, who verbally acknowledged these results. Electronically Signed   By: Mitzi Hansen M.D.   On: 07/06/2018 01:43   Ct Abdomen Pelvis W Contrast  Result Date: 07/06/2018 CLINICAL DATA:  63 y/o  M; epigastric abdominal pain and cough. EXAM: CT CHEST, ABDOMEN, AND PELVIS WITH CONTRAST TECHNIQUE: Multidetector CT imaging of the chest, abdomen and pelvis was performed following the standard protocol during bolus administration of intravenous contrast. CONTRAST:  OMNIPAQUE IOHEXOL 300 MG/ML  SOLN COMPARISON:  02/21/2019  CT of chest, abdomen, pelvis. FINDINGS: CT CHEST FINDINGS Cardiovascular: No significant vascular findings. Normal heart size. No pericardial effusion. Mediastinum/Nodes: No enlarged mediastinal, hilar, or axillary lymph nodes. Thyroid gland, trachea, and esophagus demonstrate no significant findings. Lungs/Pleura: Ground-glass opacities with peripheral basilar predominance, greater in the left lung with with areas of subpleural sparing and intralobular septal thickening. No consolidation, effusion, or pneumothorax. Musculoskeletal: No chest wall mass or suspicious bone lesions identified. CT ABDOMEN PELVIS FINDINGS Hepatobiliary: No focal liver abnormality is seen. Cholelithiasis. No gallbladder wall thickening or biliary ductal dilatation. Pancreas: Unremarkable. No pancreatic ductal dilatation or surrounding inflammatory changes. Spleen: Normal in size without focal abnormality. Adrenals/Urinary Tract: Adrenal glands are unremarkable. Kidneys are normal, without renal calculi, focal lesion, or hydronephrosis. Bladder is unremarkable. Stomach/Bowel: Stomach  is within normal limits. Appendix not identified, no pericecal inflammation. No evidence of bowel wall thickening, distention, or inflammatory changes. Vascular/Lymphatic: Aortic atherosclerosis. No enlarged abdominal or pelvic lymph nodes. Reproductive: Prostate is unremarkable. Other: No abdominal wall hernia or abnormality. No abdominopelvic ascites. Musculoskeletal: No fracture is seen. IMPRESSION: 1. There are a spectrum of findings in the lungs which can be seen with acute atypical infection (as well as other non-infectious etiologies). In particular, viral pneumonia (including COVID-19) should be considered in the appropriate clinical setting. 2. Aortic atherosclerosis. 3. Cholelithiasis. Critical Value/emergent results were called by telephone at the time of interpretation on 07/06/2018 at 1:43 am to Dr. Manson PasseyBrown, who verbally acknowledged these results.  Electronically Signed   By: Mitzi HansenLance  Furusawa-Stratton M.D.   On: 07/06/2018 01:43   Dg Chest Portable 1 View  Result Date: 07/05/2018 CLINICAL DATA:  63 y/o  M; 2 weeks of worsening shortness of breath. EXAM: PORTABLE CHEST 1 VIEW COMPARISON:  02/20/2018 CT chest FINDINGS: Normal cardiac silhouette given projection and technique. Left mid and lower lung zone infiltrate. No pleural effusion or pneumothorax. Bones are unremarkable. IMPRESSION: Left mid and lower lung zone infiltrate. Electronically Signed   By: Mitzi HansenLance  Furusawa-Stratton M.D.   On: 07/05/2018 22:49    Pending Labs Unresulted Labs (From admission, onward)    Start     Ordered   07/05/18 2254  Culture, blood (routine x 2)  BLOOD CULTURE X 2,   STAT    Question:  Patient immune status  Answer:  Normal   07/05/18 2256          Vitals/Pain Today's Vitals   07/06/18 0002 07/06/18 0135 07/06/18 0142 07/06/18 0305  BP:   (!) 159/80 (!) 163/85  Pulse: 91  92 90  Resp: 20  20 18   Temp:    (!) 100.4 F (38 C)  TempSrc:    Oral  SpO2: 96%  98% 95%  Weight:      Height:      PainSc: 9  9  9       Isolation Precautions No active isolations  Medications Medications  cefTRIAXone (ROCEPHIN) 1 g in sodium chloride 0.9 % 100 mL IVPB (0 g Intravenous Stopped 07/05/18 2340)  azithromycin (ZITHROMAX) 500 mg in sodium chloride 0.9 % 250 mL IVPB (0 mg Intravenous Stopped 07/06/18 0041)  iohexol (OMNIPAQUE) 240 MG/ML injection 50 mL (50 mLs Oral Contrast Given 07/06/18 0000)  iohexol (OMNIPAQUE) 300 MG/ML solution 125 mL (125 mLs Intravenous Contrast Given 07/06/18 0031)  sodium chloride 0.9 % bolus 1,000 mL (0 mLs Intravenous Stopped 07/06/18 0309)  insulin aspart (novoLOG) injection 8 Units (8 Units Subcutaneous Given 07/06/18 0144)    Mobility walks Moderate fall risk   Focused Assessments Pulmonary Assessment Handoff:  Lung sounds:   O2 Device: Room Air        R Recommendations: See Admitting Provider Note  Report  given to:   Additional Notes: [id number] (tele)

## 2018-07-06 NOTE — ED Notes (Signed)
Accepted to [facility] Rm. [id number], report [phone], per KIM @ [facility]

## 2018-07-06 NOTE — Care Management (Signed)
This is a no charge note  I was called to transfer [facility].  Since patient is Covid19 positive.  Already accepted to [facility] by Dr. Clearnce Sorrel.     Lorretta Harp, MD  [facility]   If 7PM-7AM, please contact night-coverage [email or web address] Password [email or web address] 07/06/2018, 1:58 AM

## 2018-07-06 NOTE — ED Provider Notes (Signed)
I assumed care of the patient from Dr. Alphonzo Lemmings at 11:00 PM.  Laboratory data confirmed positive for COVID-19.  Given patient's work of breathing as such patient subsequently discussed with Dr. Clearnce Sorrel hospitalist on-call for [facility] who accepted the patient in transfer.   Darci Current, MD 07/06/18 [phone]

## 2018-07-06 NOTE — ED Notes (Signed)
CRITICAL LAB: COVID-19 is POSITIVE, Tonya, Lab, Dr. Sable Feil, orders received

## 2018-07-06 NOTE — ED Notes (Signed)
Patient transported to CT 

## 2018-07-06 NOTE — H&P (Signed)
History and Physical    Benjamin Castaneda ION:[id number] DOB: 04-Dec-1955 DOA: 07/06/2018  PCP: [facility], [facility]   Patient coming from: Home, transfer from [facility] Complaint: SOB, COVID-19 +  HPI: Benjamin Castaneda is a 63 y.o. male with medical history significant of T2DM on oral hypglycemics, BPH admitted with SOB and bibasilar infiltrates and found to be COVID-19 positive. Pt is a poor historian but reports that for the past 2 weeks he has been feeling poorly with increased non-productive cough, and SOB. Pt reports also some rhinorrhea and sore throat. He reports that he has noted progressive SOB initially with exertion but also with rest. He denies any N/V, diarreha, rash, CP, orthopnea, LE edema, abdomianl pain. He denies any sick contacts or travel. Of note Pt's wife was hospitalized at [facility] with afib with RVR.    ED Course: In the [facility] ED Pt was noted to have reassuring vitals other than mild fever. Labs were reassuring other than mild hyperglycemia. Procalcitonin was normal. CXR showed bibasilar infiltrates. CT chest showed GGO and septal thickening. Rapid COVID test was positive. Blood cx were taken and Pt was transferred to [facility].   Review of Systems: As per HPI otherwise 10 point review of systems negative.    Past Medical History:  Diagnosis Date  . Chronic pain 07/06/2018  . Diabetes mellitus without complication (HCC)   . Type 2 diabetes mellitus without complication (HCC) 07/06/2018    Past Surgical History:  Procedure Laterality Date  . APPENDECTOMY    . NO PAST SURGERIES       reports that he has never smoked. He has never used smokeless tobacco. He reports current alcohol use of about 5.0 standard drinks of alcohol per week. He reports previous drug use.  No Known Allergies  Family History  Problem Relation Age of Onset  . Diabetes Mother     Prior to Admission medications   Medication Sig Start Date End  Date Taking? Authorizing Provider  glipiZIDE (GLUCOTROL XL) 10 MG 24 hr tablet Take 20 mg by mouth daily. 02/20/18   [provider]  levofloxacin (LEVAQUIN) 500 MG tablet Take 500 mg by mouth daily. 05/20/18   [provider]  metFORMIN (GLUCOPHAGE) 500 MG tablet Take 1,000 mg by mouth 2 (two) times daily. 02/20/18   [provider]  tamsulosin (FLOMAX) 0.4 MG CAPS capsule Take 0.4 mg by mouth daily. 05/20/18   [provider]  TRADJENTA 5 MG TABS tablet Take 5 mg by mouth daily. 04/15/18   [provider]    Physical Exam: Vitals:   07/06/18 0400  BP: 121/65  Pulse: 84  Resp: (!) 22  Temp: (!) 100.5 F (38.1 C)  TempSrc: Oral  SpO2: 97%    Constitutional: NAD, calm, comfortable Vitals:   07/06/18 0400  BP: 121/65  Pulse: 84  Resp: (!) 22  Temp: (!) 100.5 F (38.1 C)  TempSrc: Oral  SpO2: 97%   Eyes: anicteric sclera ENMT: dry MM  Neck: normal, supple Respiratory: no increased WOB, scattered rhonchi, no wheezes, crackles,   Cardiovascular: RRR, no M/R/G Abdomen: no tenderness, no masses palpated. No hepatosplenomegaly. Bowel sounds positive.  Musculoskeletal: no edema  Skin: no rashes over visible skin Neurologic: grossly intact  Psychiatric: Normal judgment and insight. Alert and oriented x 3. Normal mood.     Labs on Admission: I have personally reviewed following labs and imaging studies  CBC: Recent Labs  Lab 07/05/18  2312  WBC 6.1  NEUTROABS 4.3  HGB 13.9  HCT 42.0  MCV 88.6  PLT 225   Basic Metabolic Panel: Recent Labs  Lab 07/05/18 2312  NA 135  K 3.7  CL 102  CO2 19*  GLUCOSE 364*  BUN 12  CREATININE 0.99  CALCIUM 8.6*   GFR: Estimated Creatinine Clearance: 106.1 mL/min (by C-G formula based on SCr of 0.99 mg/dL). Liver Function Tests: Recent Labs  Lab 07/05/18 2312  AST 18  ALT 15  ALKPHOS 70  BILITOT 1.4*  PROT 8.1  ALBUMIN 3.7   No results for input(s): LIPASE, AMYLASE in the last 168  hours. No results for input(s): AMMONIA in the last 168 hours. Coagulation Profile: No results for input(s): INR, PROTIME in the last 168 hours. Cardiac Enzymes: Recent Labs  Lab 07/05/18 2312  TROPONINI <0.03   BNP (last 3 results) No results for input(s): PROBNP in the last 8760 hours. HbA1C: No results for input(s): HGBA1C in the last 72 hours. CBG: No results for input(s): GLUCAP in the last 168 hours. Lipid Profile: No results for input(s): CHOL, HDL, LDLCALC, TRIG, CHOLHDL, LDLDIRECT in the last 72 hours. Thyroid Function Tests: No results for input(s): TSH, T4TOTAL, FREET4, T3FREE, THYROIDAB in the last 72 hours. Anemia Panel: No results for input(s): VITAMINB12, FOLATE, FERRITIN, TIBC, IRON, RETICCTPCT in the last 72 hours. Urine analysis:    Component Value Date/Time   COLORURINE YELLOW (A) 07/05/2018 2312   APPEARANCEUR CLEAR (A) 07/05/2018 2312   LABSPEC 1.025 07/05/2018 2312   PHURINE 5.0 07/05/2018 2312   GLUCOSEU >=500 (A) 07/05/2018 2312   HGBUR SMALL (A) 07/05/2018 2312   BILIRUBINUR NEGATIVE 07/05/2018 2312   KETONESUR 80 (A) 07/05/2018 2312   PROTEINUR NEGATIVE 07/05/2018 2312   NITRITE NEGATIVE 07/05/2018 2312   LEUKOCYTESUR NEGATIVE 07/05/2018 2312    Radiological Exams on Admission: Ct Chest W Contrast  Result Date: 07/06/2018 CLINICAL DATA:  63 y/o  M; epigastric abdominal pain and cough. EXAM: CT CHEST, ABDOMEN, AND PELVIS WITH CONTRAST TECHNIQUE: Multidetector CT imaging of the chest, abdomen and pelvis was performed following the standard protocol during bolus administration of intravenous contrast. CONTRAST:  OMNIPAQUE IOHEXOL 300 MG/ML  SOLN COMPARISON:  02/21/2019 CT of chest, abdomen, pelvis. FINDINGS: CT CHEST FINDINGS Cardiovascular: No significant vascular findings. Normal heart size. No pericardial effusion. Mediastinum/Nodes: No enlarged mediastinal, hilar, or axillary lymph nodes. Thyroid gland, trachea, and esophagus demonstrate no  significant findings. Lungs/Pleura: Ground-glass opacities with peripheral basilar predominance, greater in the left lung with with areas of subpleural sparing and intralobular septal thickening. No consolidation, effusion, or pneumothorax. Musculoskeletal: No chest wall mass or suspicious bone lesions identified. CT ABDOMEN PELVIS FINDINGS Hepatobiliary: No focal liver abnormality is seen. Cholelithiasis. No gallbladder wall thickening or biliary ductal dilatation. Pancreas: Unremarkable. No pancreatic ductal dilatation or surrounding inflammatory changes. Spleen: Normal in size without focal abnormality. Adrenals/Urinary Tract: Adrenal glands are unremarkable. Kidneys are normal, without renal calculi, focal lesion, or hydronephrosis. Bladder is unremarkable. Stomach/Bowel: Stomach is within normal limits. Appendix not identified, no pericecal inflammation. No evidence of bowel wall thickening, distention, or inflammatory changes. Vascular/Lymphatic: Aortic atherosclerosis. No enlarged abdominal or pelvic lymph nodes. Reproductive: Prostate is unremarkable. Other: No abdominal wall hernia or abnormality. No abdominopelvic ascites. Musculoskeletal: No fracture is seen. IMPRESSION: 1. There are a spectrum of findings in the lungs which can be seen with acute atypical infection (as well as other non-infectious etiologies). In particular, viral pneumonia (including COVID-19) should be considered  in the appropriate clinical setting. 2. Aortic atherosclerosis. 3. Cholelithiasis. Critical Value/emergent results were called by telephone at the time of interpretation on 07/06/2018 at 1:43 am to Dr. Manson PasseyBrown, who verbally acknowledged these results. Electronically Signed   By: Mitzi HansenLance  Furusawa-Stratton M.D.   On: 07/06/2018 01:43   Ct Abdomen Pelvis W Contrast  Result Date: 07/06/2018 CLINICAL DATA:  63 y/o  M; epigastric abdominal pain and cough. EXAM: CT CHEST, ABDOMEN, AND PELVIS WITH CONTRAST TECHNIQUE: Multidetector CT  imaging of the chest, abdomen and pelvis was performed following the standard protocol during bolus administration of intravenous contrast. CONTRAST:  125mL OMNIPAQUE IOHEXOL 300 MG/ML  SOLN COMPARISON:  02/21/2019 CT of chest, abdomen, pelvis. FINDINGS: CT CHEST FINDINGS Cardiovascular: No significant vascular findings. Normal heart size. No pericardial effusion. Mediastinum/Nodes: No enlarged mediastinal, hilar, or axillary lymph nodes. Thyroid gland, trachea, and esophagus demonstrate no significant findings. Lungs/Pleura: Ground-glass opacities with peripheral basilar predominance, greater in the left lung with with areas of subpleural sparing and intralobular septal thickening. No consolidation, effusion, or pneumothorax. Musculoskeletal: No chest wall mass or suspicious bone lesions identified. CT ABDOMEN PELVIS FINDINGS Hepatobiliary: No focal liver abnormality is seen. Cholelithiasis. No gallbladder wall thickening or biliary ductal dilatation. Pancreas: Unremarkable. No pancreatic ductal dilatation or surrounding inflammatory changes. Spleen: Normal in size without focal abnormality. Adrenals/Urinary Tract: Adrenal glands are unremarkable. Kidneys are normal, without renal calculi, focal lesion, or hydronephrosis. Bladder is unremarkable. Stomach/Bowel: Stomach is within normal limits. Appendix not identified, no pericecal inflammation. No evidence of bowel wall thickening, distention, or inflammatory changes. Vascular/Lymphatic: Aortic atherosclerosis. No enlarged abdominal or pelvic lymph nodes. Reproductive: Prostate is unremarkable. Other: No abdominal wall hernia or abnormality. No abdominopelvic ascites. Musculoskeletal: No fracture is seen. IMPRESSION: 1. There are a spectrum of findings in the lungs which can be seen with acute atypical infection (as well as other non-infectious etiologies). In particular, viral pneumonia (including COVID-19) should be considered in the appropriate clinical setting.  2. Aortic atherosclerosis. 3. Cholelithiasis. Critical Value/emergent results were called by telephone at the time of interpretation on 07/06/2018 at 1:43 am to Dr. Manson PasseyBrown, who verbally acknowledged these results. Electronically Signed   By: Mitzi HansenLance  Furusawa-Stratton M.D.   On: 07/06/2018 01:43   Dg Chest Portable 1 View  Result Date: 07/05/2018 CLINICAL DATA:  63 y/o  M; 2 weeks of worsening shortness of breath. EXAM: PORTABLE CHEST 1 VIEW COMPARISON:  02/20/2018 CT chest FINDINGS: Normal cardiac silhouette given projection and technique. Left mid and lower lung zone infiltrate. No pleural effusion or pneumothorax. Bones are unremarkable. IMPRESSION: Left mid and lower lung zone infiltrate. Electronically Signed   By: Mitzi HansenLance  Furusawa-Stratton M.D.   On: 07/05/2018 22:49    EKG: Independently reviewed. Sinus tach, possible LVH  Assessment/Plan Active Problems:   Acute respiratory disease due to COVID-19 virus   Type 2 diabetes mellitus without complication (HCC)   Chronic pain    #) COVID-19 viral pneumonitis: clinically Pt appears to be doing well with minimal O2 requirement.  - hold IV antibiotics - will order LDH, d-dimer for prognostic  - negative pressure room, droplet/airborne precautions  #) T2DM:  - SSI, ACHS - will check A1C  #) BPH: - cont tamsulosin  FEN: - fluids: tolerating PO - electrolytes: monitor and supp - nutrition: carb restricted diet  Prophy: enox  DIspo: pending resolution of hypoxia  FULL CODE    Delaine LameShrey C Makai Agostinelli MD [facility]  If 7PM-7AM, please contact night-coverage [email or web address] Password [email or web address]  07/06/2018, 7:24  AM    

## 2018-07-06 NOTE — ED Notes (Addendum)
Pt's house keys given to first RN to be picked up by Chauncey Cruel, pt's nephew.

## 2018-07-07 DIAGNOSIS — G8929 Other chronic pain: Secondary | ICD-10-CM

## 2018-07-07 DIAGNOSIS — N400 Enlarged prostate without lower urinary tract symptoms: Secondary | ICD-10-CM

## 2018-07-07 LAB — CBC WITH DIFFERENTIAL/PLATELET
Abs Immature Granulocytes: 0.05 10*3/uL (ref 0.00–0.07)
Basophils Absolute: 0 10*3/uL (ref 0.0–0.1)
Basophils Relative: 0 %
Eosinophils Absolute: 0 10*3/uL (ref 0.0–0.5)
Eosinophils Relative: 0 %
HCT: 40.3 % (ref 39.0–52.0)
Hemoglobin: 13.6 g/dL (ref 13.0–17.0)
Immature Granulocytes: 1 %
Lymphocytes Relative: 20 %
Lymphs Abs: 1.8 10*3/uL (ref 0.7–4.0)
MCH: 30.7 pg (ref 26.0–34.0)
MCHC: 33.7 g/dL (ref 30.0–36.0)
MCV: 91 fL (ref 80.0–100.0)
Monocytes Absolute: 0.3 10*3/uL (ref 0.1–1.0)
Monocytes Relative: 4 %
Neutro Abs: 6.5 10*3/uL (ref 1.7–7.7)
Neutrophils Relative %: 75 %
Platelets: 222 10*3/uL (ref 150–400)
RBC: 4.43 MIL/uL (ref 4.22–5.81)
RDW: 13 % (ref 11.5–15.5)
WBC: 8.6 10*3/uL (ref 4.0–10.5)
nRBC: 0 % (ref 0.0–0.2)

## 2018-07-07 LAB — COMPREHENSIVE METABOLIC PANEL
ALT: 14 U/L (ref 0–44)
AST: 19 U/L (ref 15–41)
Albumin: 3.1 g/dL — ABNORMAL LOW (ref 3.5–5.0)
Alkaline Phosphatase: 61 U/L (ref 38–126)
Anion gap: 12 (ref 5–15)
BUN: 11 mg/dL (ref 6–20)
CO2: 23 mmol/L (ref 22–32)
Calcium: 8.5 mg/dL — ABNORMAL LOW (ref 8.9–10.3)
Chloride: 103 mmol/L (ref 98–111)
Creatinine, Ser: 0.94 mg/dL (ref 0.61–1.24)
GFR calc Af Amer: >60 mL/min (ref >60)
GFR calc non Af Amer: >60 mL/min (ref >60)
Glucose, Bld: 337 mg/dL — ABNORMAL HIGH (ref 70–99)
Potassium: 4.1 mmol/L (ref 3.5–5.1)
Sodium: 138 mmol/L (ref 135–145)
Total Bilirubin: 1 mg/dL (ref 0.3–1.2)
Total Protein: 7.3 g/dL (ref 6.5–8.1)

## 2018-07-07 LAB — LACTATE DEHYDROGENASE: LDH: 210 U/L — ABNORMAL HIGH (ref 98–192)

## 2018-07-07 LAB — GLUCOSE, CAPILLARY
Glucose-Capillary: 291 mg/dL — ABNORMAL HIGH (ref 70–99)
Glucose-Capillary: 225 mg/dL — ABNORMAL HIGH (ref 70–99)
Glucose-Capillary: 243 mg/dL — ABNORMAL HIGH (ref 70–99)
Glucose-Capillary: 190 mg/dL — ABNORMAL HIGH (ref 70–99)

## 2018-07-07 LAB — HEMOGLOBIN A1C
Hgb A1c MFr Bld: 10.7 % — ABNORMAL HIGH (ref 4.8–5.6)
Mean Plasma Glucose: 260.39 mg/dL

## 2018-07-07 LAB — C-REACTIVE PROTEIN: CRP: 13.8 mg/dL — ABNORMAL HIGH (ref <1.0)

## 2018-07-07 LAB — D-DIMER, QUANTITATIVE: D-Dimer, Quant: 1.27 ug/mL-FEU — ABNORMAL HIGH (ref 0.00–0.50)

## 2018-07-07 LAB — HIV ANTIBODY (ROUTINE TESTING W REFLEX): HIV Screen 4th Generation wRfx: NONREACTIVE

## 2018-07-07 MED ORDER — HYDROXYCHLOROQUINE SULFATE 200 MG PO TABS
200.0000 mg | ORAL_TABLET | Freq: Two times a day (BID) | ORAL | Status: DC
  Administered 2018-07-08 – 2018-07-11 (×7): 200 mg via ORAL
  Filled 2018-07-07 (×7): qty 1

## 2018-07-07 MED ORDER — METFORMIN HCL 500 MG PO TABS
500.0000 mg | ORAL_TABLET | Freq: Two times a day (BID) | ORAL | Status: DC
  Filled 2018-07-07 (×4): qty 1

## 2018-07-07 MED ORDER — INSULIN ASPART 100 UNIT/ML ~~LOC~~ SOLN
0.0000 [IU] | Freq: Every day | SUBCUTANEOUS | Status: DC
  Administered 2018-07-08: 3 [IU] via SUBCUTANEOUS
  Administered 2018-07-09 – 2018-07-10 (×2): 2 [IU] via SUBCUTANEOUS

## 2018-07-07 MED ORDER — HYDROXYCHLOROQUINE SULFATE 200 MG PO TABS
400.0000 mg | ORAL_TABLET | Freq: Two times a day (BID) | ORAL | Status: AC
  Administered 2018-07-07 (×2): 400 mg via ORAL
  Filled 2018-07-07 (×2): qty 2

## 2018-07-07 MED ORDER — INSULIN ASPART 100 UNIT/ML ~~LOC~~ SOLN
0.0000 [IU] | Freq: Three times a day (TID) | SUBCUTANEOUS | Status: DC
  Administered 2018-07-07: 8 [IU] via SUBCUTANEOUS
  Administered 2018-07-07 – 2018-07-08 (×3): 5 [IU] via SUBCUTANEOUS
  Administered 2018-07-08: 3 [IU] via SUBCUTANEOUS
  Administered 2018-07-08: 09:00:00 11 [IU] via SUBCUTANEOUS
  Administered 2018-07-09: 5 [IU] via SUBCUTANEOUS
  Administered 2018-07-09: 8 [IU] via SUBCUTANEOUS
  Administered 2018-07-09: 2 [IU] via SUBCUTANEOUS
  Administered 2018-07-10 – 2018-07-11 (×4): 5 [IU] via SUBCUTANEOUS
  Administered 2018-07-11: 8 [IU] via SUBCUTANEOUS

## 2018-07-07 NOTE — TOC Initial Note (Signed)
Transition of Care [facility]) - Initial/Assessment Note    Patient Details  Name: Benjamin Castaneda MRN: [id number] Date of Birth: 06/21/1955  [facility] CM/SW Contact:    Durenda Guthrie, RN Phone Number: 07/07/2018, 10:03 AM  Clinical Narrative:     63 yr old male transferred to [facility] from [place]. Patient is COVID 19 positive. Oxygen being weaned as tolerated.                    Patient Goals and CMS Choice        Expected Discharge Plan and Services    Case manager will continue to monitor for disposition as patient medically improves. May God bless him to do so.                                Prior Living Arrangements/Services                       Activities of Daily Living Home Assistive Devices/Equipment: Cane (specify quad or straight), CBG Meter, Dentures (specify type), Eyeglasses, Walker (specify type) ADL Screening (condition at time of admission) Patient's cognitive ability adequate to safely complete daily activities?: Yes Is the patient deaf or have difficulty hearing?: No Does the patient have difficulty seeing, even when wearing glasses/contacts?: Yes Does the patient have difficulty concentrating, remembering, or making decisions?: No Patient able to express need for assistance with ADLs?: Yes Does the patient have difficulty dressing or bathing?: No Independently performs ADLs?: Yes (appropriate for developmental age) Does the patient have difficulty walking or climbing stairs?: Yes Weakness of Legs: Both Weakness of Arms/Hands: None  Permission Sought/Granted                  Emotional Assessment              Admission diagnosis:  Acute respiratory disease due to COVID-19 virus Patient Active Problem List   Diagnosis Date Noted  . Acute respiratory disease due to COVID-19 virus 07/06/2018  . Type 2 diabetes mellitus without complication (HCC) 07/06/2018  . Chronic pain 07/06/2018   PCP:  [facility],  [facility]:   [facility] [address] [place] - [address] [place] [address] Phone: [phone] Fax: [phone]     Social Determinants of Health (SDOH) Interventions    Readmission Risk Interventions No flowsheet data found.

## 2018-07-07 NOTE — Progress Notes (Signed)
PROGRESS NOTE  Benjamin Castaneda  GNF:[id number] DOB: 24-Oct-1955 DOA: 07/06/2018 PCP: Inc, [facility]   Brief Narrative: Benjamin Castaneda is a 63 y.o. male with a history of T2DM, BPH, and chronic pain who presented to [facility] ED 4/17 with dyspnea, found to have low grade fever, hyperglycemia, bibasilar infiltrates on CXR and covid-19 positive. He is a vague historian and complained of abdominal pain. CT abd/pelvis showed cholelithiasis without ductal dilation or inflammation and no other intraabdominal pathology, but there were GGOs with bibasilar predominance, areas of subpleural sparing, and intralobular septal thickening. He was given azithromycin and ceftriaxone before transfer to [facility] 4/18. Antibiotics stopped as procalcitonin was undetectable. Fevers and inflammatory markers trending upward and hydroxychloroquine started 4/19.    Assessment & Plan: Active Problems:   Acute respiratory disease due to COVID-19 virus   Type 2 diabetes mellitus without complication (HCC)   Chronic pain  Acute hypoxic respiratory failure due to covid-19 viral pneumonia: Remains at risk of progression to ARDS. - Continue supplemental oxygen to maintain SpO2 >90% - Subjective dyspnea worsened and LDH, d-dimer slightly up from yesterday. This is consistent with increased cytokine activation as evidenced by fever to 103F overnight, though no increased hypoxia at this time. Will start HCQ, monitor QTc (currently ). The use of this medication is off-label, and its efficacy in this clinical situation is not definitively proven. The risks including death by cardiac arrhythmia as well as side effects of rash, LFT elevations, and rarely retinopathy were discussed and the patient chooses to proceed. - Continue airborne, contact precautions. PPE including surgical gown, gloves, face shield, cap, shoe covers, and N-95 used during this encounter in a negative pressure room.  - Check daily labs: CBC w/diff, CMP,  d-dimer, LDH, CRP - Troponin is undetectable. Hep B SAg will be checked.  - Enoxaparin prophylactic dose.  - Maintain euvolemia/net negative.  - Avoid NSAIDs - Goals of care were discussed. Would want trial of intubation.    NIDT2DM with hyperglycemia: HbA1c 10.7% indicating chronically poor control. Suspect higher CBGs in infection.  - Augment SSI to moderate and monitor for further titration needs. - Carb-mod diet  Hyperbilirubinemia: Resolved. In the absence of other LFT elevations, do not suspect cholelithiasis seen on CT abd/pelvis is clinically salient finding.   BPH:  - Continue flomax, check PVR as needed.  Chronic pain:  - Continue tramadol - Avoid NSAIDs.  Obesity: BMI 31.  - Weight loss recommended long term.  DVT prophylaxis: Lovenox Code Status: Full code confirmed Family Communication: Wife updated in person as she is also admitted on this medical floor. Disposition Plan: Uncertain. Guarded prognosis.  Consultants:   None  Procedures:   None  Antimicrobials:  Ceftriaxone, azithromycin 4/17  Hydroxychloroquine 4/19 >>    Subjective: Feels shortness of breath is severe, even when moving around in bed. No chest pain. No orthopnea or leg swelling.  Objective: Vitals:   07/06/18 2349 07/07/18 0000 07/07/18 0405 07/07/18 0906  BP: 127/85 135/65 125/65 133/69  Pulse: 92 84  90  Resp: (!) 24 (!) 27 (!) 22 17  Temp: (!) 101.7 F (38.7 C)  (!) 102.9 F (39.4 C) 99.8 F (37.7 C)  TempSrc: Oral  Oral Oral  SpO2: 97% 96%  94%  Weight:      Height:        Intake/Output Summary (Last 24 hours) at 07/07/2018 1152 Last data filed at 07/07/2018 1000 Gross per 24 hour  Intake 717 ml  Output 1000  ml  Net -283 ml   Filed Weights   07/06/18 2248  Weight: 113 kg   Gen: 63 y.o. male in not in distress at this time Pulm: Non-labored tachypnea without crackles or wheezes.  CV: Regular rate and rhythm. No murmur, rub, or gallop. No JVD, no pedal edema. GI:  Abdomen soft, tender periumbilically which is different from prior exam. No rebound or guarding or distention. Lower vertical midline scar again noted. +BS, no organomegaly. Ext: Warm, no deformities Skin: No rashes, lesions or ulcers. No jaundice. Neuro: Alert and oriented. No focal neurological deficits. Psych: Judgement and insight appear normal. Cognitive function is low-normal. Mood & affect appropriate.   Data Reviewed: I have personally reviewed following labs and imaging studies  CBC: Recent Labs  Lab 07/05/18 2312 07/06/18 0815 07/07/18 0845  WBC 6.1 5.8 8.6  NEUTROABS 4.3  --  6.5  HGB 13.9 13.1 13.6  HCT 42.0 38.7* 40.3  MCV 88.6 89.2 91.0  PLT 225 197 222   Basic Metabolic Panel: Recent Labs  Lab 07/05/18 2312 07/07/18 0845  NA 135 138  K 3.7 4.1  CL 102 103  CO2 19* 23  GLUCOSE 364* 337*  BUN 12 11  CREATININE 0.99 0.94  CALCIUM 8.6* 8.5*   GFR: Estimated Creatinine Clearance: 111.7 mL/min (by C-G formula based on SCr of 0.94 mg/dL). Liver Function Tests: Recent Labs  Lab 07/05/18 2312 07/07/18 0845  AST 18 19  ALT 15 14  ALKPHOS 70 61  BILITOT 1.4* 1.0  PROT 8.1 7.3  ALBUMIN 3.7 3.1*   No results for input(s): LIPASE, AMYLASE in the last 168 hours. No results for input(s): AMMONIA in the last 168 hours. Coagulation Profile: No results for input(s): INR, PROTIME in the last 168 hours. Cardiac Enzymes: Recent Labs  Lab 07/05/18 2312  TROPONINI <0.03   BNP (last 3 results) No results for input(s): PROBNP in the last 8760 hours. HbA1C: Recent Labs    07/07/18 0745  HGBA1C 10.7*   CBG: Recent Labs  Lab 07/06/18 0828 07/06/18 1044 07/06/18 1535 07/06/18 2147 07/07/18 0802  GLUCAP 213* 219* 334* 249* 291*   Lipid Profile: No results for input(s): CHOL, HDL, LDLCALC, TRIG, CHOLHDL, LDLDIRECT in the last 72 hours. Thyroid Function Tests: No results for input(s): TSH, T4TOTAL, FREET4, T3FREE, THYROIDAB in the last 72 hours. Anemia  Panel: No results for input(s): VITAMINB12, FOLATE, FERRITIN, TIBC, IRON, RETICCTPCT in the last 72 hours. Urine analysis:    Component Value Date/Time   COLORURINE YELLOW (A) 07/05/2018 2312   APPEARANCEUR CLEAR (A) 07/05/2018 2312   LABSPEC 1.025 07/05/2018 2312   PHURINE 5.0 07/05/2018 2312   GLUCOSEU >=500 (A) 07/05/2018 2312   HGBUR SMALL (A) 07/05/2018 2312   BILIRUBINUR NEGATIVE 07/05/2018 2312   KETONESUR 80 (A) 07/05/2018 2312   PROTEINUR NEGATIVE 07/05/2018 2312   NITRITE NEGATIVE 07/05/2018 2312   LEUKOCYTESUR NEGATIVE 07/05/2018 2312   Recent Results (from the past 240 hour(s))  Culture, blood (routine x 2)     Status: None (Preliminary result)   Collection Time: 07/05/18 11:12 PM  Result Value Ref Range Status   Specimen Description BLOOD RIGHT ANTECUBITAL  Final   Special Requests   Final    BOTTLES DRAWN AEROBIC AND ANAEROBIC Blood Culture results may not be optimal due to an excessive volume of blood received in culture bottles   Culture   Final    NO GROWTH 2 DAYS Performed at [facility], [address] [place],  [place] [address]    Report Status PENDING  Incomplete  Culture, blood (routine x 2)     Status: None (Preliminary result)   Collection Time: 07/05/18 11:12 PM  Result Value Ref Range Status   Specimen Description BLOOD LEFT FOREARM  Final   Special Requests   Final    BOTTLES DRAWN AEROBIC AND ANAEROBIC Blood Culture results may not be optimal due to an excessive volume of blood received in culture bottles   Culture   Final    NO GROWTH 1 DAY Performed at [facility], [address] [place], [place] [address]    Report Status PENDING  Incomplete  SARS Coronavirus 2 [facility] order, Performed in [facility] hospital lab)     Status: Abnormal   Collection Time: 07/05/18 11:12 PM  Result Value Ref Range Status   SARS Coronavirus 2 POSITIVE (A) NEGATIVE Final    Comment: RESULT CALLED TO, READ BACK BY AND VERIFIED WITH: NOEL  WEBSTER AT 0026 07/06/2018.  TFK (NOTE) If result is NEGATIVE SARS-CoV-2 target nucleic acids are NOT DETECTED. The SARS-CoV-2 RNA is generally detectable in upper and lower  respiratory specimens during the acute phase of infection. The lowest  concentration of SARS-CoV-2 viral copies this assay can detect is 250  copies / mL. A negative result does not preclude SARS-CoV-2 infection  and should not be used as the sole basis for treatment or other  patient management decisions.  A negative result may occur with  improper specimen collection / handling, submission of specimen other  than nasopharyngeal swab, presence of viral mutation(s) within the  areas targeted by this assay, and inadequate number of viral copies  (<250 copies / mL). A negative result must be combined with clinical  observations, patient history, and epidemiological information. If result is POSITIVE SARS-CoV-2 target nucleic acids are DETECTED.  The SARS-CoV-2 RNA is generally detectable in upper and lower  respiratory specimens during the acute phase of infection.  Positive  results are indicative of active infection with SARS-CoV-2.  Clinical  correlation with patient history and other diagnostic information is  necessary to determine patient infection status.  Positive results do  not rule out bacterial infection or co-infection with other viruses. If result is PRESUMPTIVE POSTIVE SARS-CoV-2 nucleic acids MAY BE PRESENT.   A presumptive positive result was obtained on the submitted specimen  and confirmed on repeat testing.  While 2019 novel coronavirus  (SARS-CoV-2) nucleic acids may be present in the submitted sample  additional confirmatory testing may be necessary for epidemiological  and / or clinical management purposes  to differentiate between  SARS-CoV-2 and other Sarbecovirus currently known to infect humans.  If clinically indicated additional testing with an alternate test  methodology [phone])   is advised. The SARS-CoV-2 RNA is generally  detectable in upper and lower respiratory specimens during the acute  phase of infection. The expected result is Negative. Fact Sheet for Patients:  BoilerBrush.com.cy Fact Sheet for Healthcare Providers: https://pope.com/ This test is not yet approved or cleared by the [place] FDA and has been authorized for detection and/or diagnosis of SARS-CoV-2 by FDA under an Emergency Use Authorization (EUA).  This EUA will remain in effect (meaning this test can be used) for the duration of the COVID-19 declaration under Section 564(b)(1) of the Act, 21 U.S.C. section 360bbb-3(b)(1), unless the authorization is terminated or revoked sooner. Performed at [facility], [address] [place], [place] [address]       Radiology Studies:  Ct Chest W Contrast  Result Date: 07/06/2018 CLINICAL DATA:  63 y/o  M; epigastric abdominal pain and cough. EXAM: CT CHEST, ABDOMEN, AND PELVIS WITH CONTRAST TECHNIQUE: Multidetector CT imaging of the chest, abdomen and pelvis was performed following the standard protocol during bolus administration of intravenous contrast. CONTRAST:  OMNIPAQUE IOHEXOL 300 MG/ML  SOLN COMPARISON:  02/21/2019 CT of chest, abdomen, pelvis. FINDINGS: CT CHEST FINDINGS Cardiovascular: No significant vascular findings. Normal heart size. No pericardial effusion. Mediastinum/Nodes: No enlarged mediastinal, hilar, or axillary lymph nodes. Thyroid gland, trachea, and esophagus demonstrate no significant findings. Lungs/Pleura: Ground-glass opacities with peripheral basilar predominance, greater in the left lung with with areas of subpleural sparing and intralobular septal thickening. No consolidation, effusion, or pneumothorax. Musculoskeletal: No chest wall mass or suspicious bone lesions identified. CT ABDOMEN PELVIS FINDINGS Hepatobiliary: No focal liver abnormality is seen.  Cholelithiasis. No gallbladder wall thickening or biliary ductal dilatation. Pancreas: Unremarkable. No pancreatic ductal dilatation or surrounding inflammatory changes. Spleen: Normal in size without focal abnormality. Adrenals/Urinary Tract: Adrenal glands are unremarkable. Kidneys are normal, without renal calculi, focal lesion, or hydronephrosis. Bladder is unremarkable. Stomach/Bowel: Stomach is within normal limits. Appendix not identified, no pericecal inflammation. No evidence of bowel wall thickening, distention, or inflammatory changes. Vascular/Lymphatic: Aortic atherosclerosis. No enlarged abdominal or pelvic lymph nodes. Reproductive: Prostate is unremarkable. Other: No abdominal wall hernia or abnormality. No abdominopelvic ascites. Musculoskeletal: No fracture is seen. IMPRESSION: 1. There are a spectrum of findings in the lungs which can be seen with acute atypical infection (as well as other non-infectious etiologies). In particular, viral pneumonia (including COVID-19) should be considered in the appropriate clinical setting. 2. Aortic atherosclerosis. 3. Cholelithiasis. Critical Value/emergent results were called by telephone at the time of interpretation on 07/06/2018 at 1:43 am to Dr. Manson Passey, who verbally acknowledged these results. Electronically Signed   By: Mitzi Hansen M.D.   On: 07/06/2018 01:43   Ct Abdomen Pelvis W Contrast  Result Date: 07/06/2018 CLINICAL DATA:  63 y/o  M; epigastric abdominal pain and cough. EXAM: CT CHEST, ABDOMEN, AND PELVIS WITH CONTRAST TECHNIQUE: Multidetector CT imaging of the chest, abdomen and pelvis was performed following the standard protocol during bolus administration of intravenous contrast. CONTRAST:  OMNIPAQUE IOHEXOL 300 MG/ML  SOLN COMPARISON:  02/21/2019 CT of chest, abdomen, pelvis. FINDINGS: CT CHEST FINDINGS Cardiovascular: No significant vascular findings. Normal heart size. No pericardial effusion. Mediastinum/Nodes: No  enlarged mediastinal, hilar, or axillary lymph nodes. Thyroid gland, trachea, and esophagus demonstrate no significant findings. Lungs/Pleura: Ground-glass opacities with peripheral basilar predominance, greater in the left lung with with areas of subpleural sparing and intralobular septal thickening. No consolidation, effusion, or pneumothorax. Musculoskeletal: No chest wall mass or suspicious bone lesions identified. CT ABDOMEN PELVIS FINDINGS Hepatobiliary: No focal liver abnormality is seen. Cholelithiasis. No gallbladder wall thickening or biliary ductal dilatation. Pancreas: Unremarkable. No pancreatic ductal dilatation or surrounding inflammatory changes. Spleen: Normal in size without focal abnormality. Adrenals/Urinary Tract: Adrenal glands are unremarkable. Kidneys are normal, without renal calculi, focal lesion, or hydronephrosis. Bladder is unremarkable. Stomach/Bowel: Stomach is within normal limits. Appendix not identified, no pericecal inflammation. No evidence of bowel wall thickening, distention, or inflammatory changes. Vascular/Lymphatic: Aortic atherosclerosis. No enlarged abdominal or pelvic lymph nodes. Reproductive: Prostate is unremarkable. Other: No abdominal wall hernia or abnormality. No abdominopelvic ascites. Musculoskeletal: No fracture is seen. IMPRESSION: 1. There are a spectrum of findings in the lungs which can be seen with acute atypical infection (as well as other non-infectious  etiologies). In particular, viral pneumonia (including COVID-19) should be considered in the appropriate clinical setting. 2. Aortic atherosclerosis. 3. Cholelithiasis. Critical Value/emergent results were called by telephone at the time of interpretation on 07/06/2018 at 1:43 am to Dr. Manson Passey, who verbally acknowledged these results. Electronically Signed   By: Mitzi Hansen M.D.   On: 07/06/2018 01:43   Dg Chest Portable 1 View  Result Date: 07/05/2018 CLINICAL DATA:  63 y/o  M; 2 weeks of  worsening shortness of breath. EXAM: PORTABLE CHEST 1 VIEW COMPARISON:  02/20/2018 CT chest FINDINGS: Normal cardiac silhouette given projection and technique. Left mid and lower lung zone infiltrate. No pleural effusion or pneumothorax. Bones are unremarkable. IMPRESSION: Left mid and lower lung zone infiltrate. Electronically Signed   By: Mitzi Hansen M.D.   On: 07/05/2018 22:49    Scheduled Meds:  enoxaparin (LOVENOX) injection  40 mg Subcutaneous Q24H   hydroxychloroquine  400 mg Oral BID   Followed by   Melene Muller ON 07/08/2018] hydroxychloroquine  200 mg Oral BID   insulin aspart  0-15 Units Subcutaneous TID WC   insulin aspart  0-5 Units Subcutaneous QHS   sodium chloride flush  3 mL Intravenous Q12H   tamsulosin  0.4 mg Oral Daily   Continuous Infusions:  sodium chloride       LOS: 1 day   Time spent: 35 minutes.  [name], MD [facility] [email or web address] Password [facility] 07/07/2018, 11:52 AM

## 2018-07-08 LAB — CBC WITH DIFFERENTIAL/PLATELET
Abs Immature Granulocytes: 0.05 10*3/uL (ref 0.00–0.07)
Basophils Absolute: 0 10*3/uL (ref 0.0–0.1)
Basophils Relative: 0 %
Eosinophils Absolute: 0 10*3/uL (ref 0.0–0.5)
Eosinophils Relative: 0 %
HCT: 37.4 % — ABNORMAL LOW (ref 39.0–52.0)
Hemoglobin: 12.4 g/dL — ABNORMAL LOW (ref 13.0–17.0)
Immature Granulocytes: 1 %
Lymphocytes Relative: 23 %
Lymphs Abs: 1.8 10*3/uL (ref 0.7–4.0)
MCH: 29.8 pg (ref 26.0–34.0)
MCHC: 33.2 g/dL (ref 30.0–36.0)
MCV: 89.9 fL (ref 80.0–100.0)
Monocytes Absolute: 0.3 10*3/uL (ref 0.1–1.0)
Monocytes Relative: 4 %
Neutro Abs: 5.8 10*3/uL (ref 1.7–7.7)
Neutrophils Relative %: 72 %
Platelets: 247 10*3/uL (ref 150–400)
RBC: 4.16 MIL/uL — ABNORMAL LOW (ref 4.22–5.81)
RDW: 12.9 % (ref 11.5–15.5)
WBC: 8.1 10*3/uL (ref 4.0–10.5)
nRBC: 0 % (ref 0.0–0.2)

## 2018-07-08 LAB — COMPREHENSIVE METABOLIC PANEL
ALT: 13 U/L (ref 0–44)
AST: 17 U/L (ref 15–41)
Albumin: 2.9 g/dL — ABNORMAL LOW (ref 3.5–5.0)
Alkaline Phosphatase: 52 U/L (ref 38–126)
Anion gap: 11 (ref 5–15)
BUN: 10 mg/dL (ref 6–20)
CO2: 23 mmol/L (ref 22–32)
Calcium: 8.3 mg/dL — ABNORMAL LOW (ref 8.9–10.3)
Chloride: 103 mmol/L (ref 98–111)
Creatinine, Ser: 0.79 mg/dL (ref 0.61–1.24)
GFR calc Af Amer: >60 mL/min (ref >60)
GFR calc non Af Amer: >60 mL/min (ref >60)
Glucose, Bld: 226 mg/dL — ABNORMAL HIGH (ref 70–99)
Potassium: 3.4 mmol/L — ABNORMAL LOW (ref 3.5–5.1)
Sodium: 137 mmol/L (ref 135–145)
Total Bilirubin: 0.7 mg/dL (ref 0.3–1.2)
Total Protein: 7 g/dL (ref 6.5–8.1)

## 2018-07-08 LAB — LACTATE DEHYDROGENASE: LDH: 197 U/L — ABNORMAL HIGH (ref 98–192)

## 2018-07-08 LAB — GLUCOSE, CAPILLARY
Glucose-Capillary: 326 mg/dL — ABNORMAL HIGH (ref 70–99)
Glucose-Capillary: 197 mg/dL — ABNORMAL HIGH (ref 70–99)
Glucose-Capillary: 241 mg/dL — ABNORMAL HIGH (ref 70–99)
Glucose-Capillary: 264 mg/dL — ABNORMAL HIGH (ref 70–99)

## 2018-07-08 LAB — D-DIMER, QUANTITATIVE: D-Dimer, Quant: 1.06 ug/mL-FEU — ABNORMAL HIGH (ref 0.00–0.50)

## 2018-07-08 LAB — C-REACTIVE PROTEIN: CRP: 15.8 mg/dL — ABNORMAL HIGH (ref <1.0)

## 2018-07-08 MED ORDER — INSULIN ASPART 100 UNIT/ML ~~LOC~~ SOLN
3.0000 [IU] | Freq: Three times a day (TID) | SUBCUTANEOUS | Status: DC
  Administered 2018-07-08 (×2): 3 [IU] via SUBCUTANEOUS

## 2018-07-08 MED ORDER — LINAGLIPTIN 5 MG PO TABS
5.0000 mg | ORAL_TABLET | Freq: Every day | ORAL | Status: DC
  Administered 2018-07-08 – 2018-07-11 (×4): 5 mg via ORAL
  Filled 2018-07-08 (×5): qty 1

## 2018-07-08 MED ORDER — INSULIN GLARGINE 100 UNIT/ML ~~LOC~~ SOLN
10.0000 [IU] | Freq: Every day | SUBCUTANEOUS | Status: DC
  Administered 2018-07-08: 11:00:00 10 [IU] via SUBCUTANEOUS
  Filled 2018-07-08 (×2): qty 0.1

## 2018-07-08 MED ORDER — POTASSIUM CHLORIDE CRYS ER 20 MEQ PO TBCR
40.0000 meq | EXTENDED_RELEASE_TABLET | Freq: Once | ORAL | Status: AC
  Administered 2018-07-08: 40 meq via ORAL
  Filled 2018-07-08: qty 2

## 2018-07-08 NOTE — Progress Notes (Signed)
Inpatient Diabetes Program Recommendations  AACE/ADA: New Consensus Statement on Inpatient Glycemic Control (2015)  Target Ranges:  Prepandial:   less than 140 mg/dL      Peak postprandial:   less than 180 mg/dL (1-2 hours)      Critically ill patients:  140 - 180 mg/dL   Lab Results  Component Value Date   GLUCAP 326 (H) 07/08/2018   HGBA1C 10.7 (H) 07/07/2018    Review of Glycemic Control Results for Benjamin Castaneda, Benjamin Castaneda (MRN [id number]) as of 07/08/2018 10:26  Ref. Range 07/07/2018 08:02 07/07/2018 13:55 07/07/2018 17:50 07/07/2018 21:53 07/08/2018 08:10  Glucose-Capillary Latest Ref Range: 70 - 99 mg/dL 568 (H) 127 (H) 517 (H) 190 (H) 326 (H)   Diabetes history: DM2 Outpatient Diabetes medications: Glucotrol 20 mg daily + Metformin 1 gm bid + Tradjenta 5 mg qd Current orders for Inpatient glycemic control: Lantus 10 units + Tradjenta 5 mg qd + Novolog moderate correction tid + hs 0-5 units  Inpatient Diabetes Program Recommendations:   Patient has received 29 units Novolog correction over the past 24 hrs. -Increase Lantus to 15 units -Add Novolog 5 units tid meal coverage if eats 50%  Thank you, Darel Hong E. Andoni Busch, RN, MSN, CDE  Diabetes Coordinator Inpatient Glycemic Control Team Team Pager [phone] (8am-5pm) 07/08/2018 10:31 AM

## 2018-07-08 NOTE — Progress Notes (Signed)
PROGRESS NOTE  Benjamin Castaneda  [id number] DOB: Sep 04, 1955 DOA: 07/06/2018 PCP: [facility], [facility]   Brief Narrative: Benjamin Castaneda is a 63 y.o. male with a history of T2DM, BPH, and chronic pain who presented to [facility] ED 4/17 with dyspnea, found to have low grade fever, hyperglycemia, bibasilar infiltrates on CXR and covid-19 positive. He is a vague historian and complained of abdominal pain. CT abd/pelvis showed cholelithiasis without ductal dilation or inflammation and no other intraabdominal pathology, but there were GGOs with bibasilar predominance, areas of subpleural sparing, and intralobular septal thickening. He was given azithromycin and ceftriaxone before [facility]. Antibiotics stopped as procalcitonin was undetectable. Fevers and inflammatory markers trending upward and hydroxychloroquine started 4/19.    Assessment & Plan: Principal Problem:   Acute respiratory disease due to COVID-19 virus Active Problems:   Type 2 diabetes mellitus without complication (HCC)   Chronic pain   Hyperbilirubinemia   BPH (benign prostatic hyperplasia)  Acute hypoxic respiratory failure due to covid-19 viral pneumonia: Remains at risk of progression to ARDS. - Continue supplemental oxygen to maintain SpO2 >90% - Inflammatory markers continuing upward trend consistent with increased cytokine activation, though no increased hypoxia at this time. Continue HCQ, monitor QTc (currently 424msec). The use of this medication is off-label, and its efficacy in this clinical situation is not definitively proven. The risks including death by cardiac arrhythmia as well as side effects of rash, LFT elevations, and rarely retinopathy were discussed and the patient chooses to proceed. - Continue droplet, contact precautions. PPE including surgical gown, gloves, face shield, cap, shoe covers, and N-95 used during this encounter in a negative pressure room.  - Check daily labs: CBC w/diff, CMP,  d-dimer, LDH, CRP - Troponin is undetectable. Hep B SAg pending. - Enoxaparin prophylactic dose.  - Maintain euvolemia, cumulative I/O is +130cc. - Avoid NSAIDs - Goals of care were discussed. Would want trial of intubation.    NIDT2DM with hyperglycemia: HbA1c 10.7% indicating chronically poor control. Suspect higher CBGs in infection as well.  - Continue mod SSI, add lantus 10u and monitor. - Restart home linsgliptin - Carb-mod diet  Hyperbilirubinemia: Resolved. In the absence of other LFT elevations, do not suspect cholelithiasis seen on CT abd/pelvis is clinically salient finding.   BPH:  - Continue flomax, seems to be voiding well, check PVR as needed.  Hypokalemia:  - Supplement and recheck with Mg in AM.  Chronic pain:  - Continue tramadol - Avoid NSAIDs.  Obesity: BMI 31.  - Weight loss recommended long term.  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  DVT prophylaxis: Lovenox Code Status: Full code confirmed Family Communication: Wife updated in person as she is also admitted on this medical floor. Disposition Plan: Uncertain, will involve PT to minimize loss of strength while hospitalized.  Consultants:   None  Procedures:   None  Antimicrobials:  Ceftriaxone, azithromycin 4/17  Hydroxychloroquine 4/19 - 4/23    Subjective: Feeling better today, no shortness of breath at rest. Still winded when standing. No chest pain. Abdominal pain resolved. Eating well, CBGs very high.    Objective: Vitals:   07/07/18 1800 07/07/18 1935 07/07/18 2159 07/08/18 0700  BP: 115/70   119/64  Pulse: 73   81  Resp: 19   (!) 26  Temp:   98.9 F (37.2 C) 98.1 F (36.7 C)  TempSrc:  Oral Oral Oral  SpO2: 98%   92%  Weight:  Height:       Gen: 63 y.o. male in no distress Pulm: Nonlabored but remains tachypneic with supplemental oxygen. No wheezing, rhonchi, or crackles. noted. CV: Regular rate and rhythm. No murmur, rub, or gallop. No JVD, no  dependent edema. GI: Abdomen soft, no tenderness today. Remains non-distended, with normoactive bowel sounds.  Ext: Warm, no deformities Skin: No rashes, lesions or ulcers on visualized skin. Neuro: Alert and oriented. No focal neurological deficits. Psych: Judgement and insight appear fair. Mood euthymic & affect congruent.   CBC: Recent Labs  Lab 07/05/18 2312 07/06/18 0815 07/07/18 0845 07/08/18 0410  WBC 6.1 5.8 8.6 8.1  NEUTROABS 4.3  --  6.5 5.8  HGB 13.9 13.1 13.6 12.4*  HCT 42.0 38.7* 40.3 37.4*  MCV 88.6 89.2 91.0 89.9  PLT 225 197 222 247   Basic Metabolic Panel: Recent Labs  Lab 07/05/18 2312 07/07/18 0845 07/08/18 0410  NA 135 138 137  K 3.7 4.1 3.4*  CL 102 103 103  CO2 19* 23 23  GLUCOSE 364* 337* 226*  BUN 12 11 10   CREATININE 0.99 0.94 0.79  CALCIUM 8.6* 8.5* 8.3*   Liver Function Tests: Recent Labs  Lab 07/05/18 2312 07/07/18 0845 07/08/18 0410  AST 18 19 17   ALT 15 14 13   ALKPHOS 70 61 52  BILITOT 1.4* 1.0 0.7  PROT 8.1 7.3 7.0  ALBUMIN 3.7 3.1* 2.9*   Cardiac Enzymes: Recent Labs  Lab 07/05/18 2312  TROPONINI <0.03   HbA1C: Recent Labs    07/07/18 0745  HGBA1C 10.7*   CBG: Recent Labs  Lab 07/07/18 0802 07/07/18 1355 07/07/18 1750 07/07/18 2153 07/08/18 0810  GLUCAP 291* 225* 243* 190* 326*   Urine analysis:    Component Value Date/Time   COLORURINE YELLOW (A) 07/05/2018 2312   APPEARANCEUR CLEAR (A) 07/05/2018 2312   LABSPEC 1.025 07/05/2018 2312   PHURINE 5.0 07/05/2018 2312   GLUCOSEU >=500 (A) 07/05/2018 2312   HGBUR SMALL (A) 07/05/2018 2312   BILIRUBINUR NEGATIVE 07/05/2018 2312   KETONESUR 80 (A) 07/05/2018 2312   PROTEINUR NEGATIVE 07/05/2018 2312   NITRITE NEGATIVE 07/05/2018 2312   LEUKOCYTESUR NEGATIVE 07/05/2018 2312   SARS-CoV2: Positive   LOS: 2 days   Time spent: 35 minutes.  [name], MD [facility] [email or web address] Password [facility] 07/08/2018, 9:27 AM

## 2018-07-08 NOTE — ED Notes (Signed)
Pt A&O  X4, speech impediment, hx of DM reports typical CBG "about 150", wife in hospital, pt reports needing to get home to take care of dog

## 2018-07-09 LAB — COMPREHENSIVE METABOLIC PANEL
ALT: 13 U/L (ref 0–44)
AST: 18 U/L (ref 15–41)
Albumin: 2.8 g/dL — ABNORMAL LOW (ref 3.5–5.0)
Alkaline Phosphatase: 60 U/L (ref 38–126)
Anion gap: 10 (ref 5–15)
BUN: 13 mg/dL (ref 6–20)
CO2: 27 mmol/L (ref 22–32)
Calcium: 8.7 mg/dL — ABNORMAL LOW (ref 8.9–10.3)
Chloride: 102 mmol/L (ref 98–111)
Creatinine, Ser: 0.78 mg/dL (ref 0.61–1.24)
GFR calc Af Amer: >60 mL/min (ref >60)
GFR calc non Af Amer: >60 mL/min (ref >60)
Glucose, Bld: 236 mg/dL — ABNORMAL HIGH (ref 70–99)
Potassium: 3.7 mmol/L (ref 3.5–5.1)
Sodium: 139 mmol/L (ref 135–145)
Total Bilirubin: 0.6 mg/dL (ref 0.3–1.2)
Total Protein: 6.9 g/dL (ref 6.5–8.1)

## 2018-07-09 LAB — CBC WITH DIFFERENTIAL/PLATELET
Abs Immature Granulocytes: 0.04 10*3/uL (ref 0.00–0.07)
Basophils Absolute: 0 10*3/uL (ref 0.0–0.1)
Basophils Relative: 0 %
Eosinophils Absolute: 0.1 10*3/uL (ref 0.0–0.5)
Eosinophils Relative: 2 %
HCT: 39 % (ref 39.0–52.0)
Hemoglobin: 12.6 g/dL — ABNORMAL LOW (ref 13.0–17.0)
Immature Granulocytes: 1 %
Lymphocytes Relative: 34 %
Lymphs Abs: 1.6 10*3/uL (ref 0.7–4.0)
MCH: 29.3 pg (ref 26.0–34.0)
MCHC: 32.3 g/dL (ref 30.0–36.0)
MCV: 90.7 fL (ref 80.0–100.0)
Monocytes Absolute: 0.4 10*3/uL (ref 0.1–1.0)
Monocytes Relative: 8 %
Neutro Abs: 2.6 10*3/uL (ref 1.7–7.7)
Neutrophils Relative %: 55 %
Platelets: 273 10*3/uL (ref 150–400)
RBC: 4.3 MIL/uL (ref 4.22–5.81)
RDW: 12.8 % (ref 11.5–15.5)
WBC: 4.8 10*3/uL (ref 4.0–10.5)
nRBC: 0 % (ref 0.0–0.2)

## 2018-07-09 LAB — GLUCOSE, CAPILLARY
Glucose-Capillary: 260 mg/dL — ABNORMAL HIGH (ref 70–99)
Glucose-Capillary: 205 mg/dL — ABNORMAL HIGH (ref 70–99)
Glucose-Capillary: 149 mg/dL — ABNORMAL HIGH (ref 70–99)
Glucose-Capillary: 235 mg/dL — ABNORMAL HIGH (ref 70–99)

## 2018-07-09 LAB — C-REACTIVE PROTEIN: CRP: 12.1 mg/dL — ABNORMAL HIGH (ref <1.0)

## 2018-07-09 LAB — HEPATITIS B SURFACE ANTIGEN: Hepatitis B Surface Ag: NEGATIVE

## 2018-07-09 LAB — LACTATE DEHYDROGENASE: LDH: 180 U/L (ref 98–192)

## 2018-07-09 LAB — D-DIMER, QUANTITATIVE: D-Dimer, Quant: 1.44 ug/mL-FEU — ABNORMAL HIGH (ref 0.00–0.50)

## 2018-07-09 MED ORDER — INSULIN ASPART 100 UNIT/ML ~~LOC~~ SOLN
5.0000 [IU] | Freq: Three times a day (TID) | SUBCUTANEOUS | Status: DC
  Administered 2018-07-09 – 2018-07-10 (×4): 5 [IU] via SUBCUTANEOUS

## 2018-07-09 MED ORDER — INSULIN GLARGINE 100 UNIT/ML ~~LOC~~ SOLN
15.0000 [IU] | Freq: Every day | SUBCUTANEOUS | Status: DC
  Administered 2018-07-09: 15 [IU] via SUBCUTANEOUS
  Filled 2018-07-09 (×2): qty 0.15

## 2018-07-09 MED ORDER — BENZONATATE 100 MG PO CAPS
100.0000 mg | ORAL_CAPSULE | Freq: Three times a day (TID) | ORAL | Status: DC | PRN
  Administered 2018-07-09: 09:00:00 100 mg via ORAL
  Filled 2018-07-09 (×2): qty 1

## 2018-07-09 MED ORDER — DEXTROMETHORPHAN POLISTIREX ER 30 MG/5ML PO SUER
30.0000 mg | Freq: Two times a day (BID) | ORAL | Status: DC
  Administered 2018-07-09 – 2018-07-11 (×5): 30 mg via ORAL
  Filled 2018-07-09 (×7): qty 5

## 2018-07-09 NOTE — Progress Notes (Signed)
PROGRESS NOTE  Benjamin Castaneda  JJH:[id number] DOB: June 30, 1955 DOA: 07/06/2018 PCP: [facility]   Brief Narrative: Benjamin Castaneda is a 63 y.o. male with a history of T2DM, BPH, and chronic pain who presented to [facility] ED 4/17 with dyspnea, found to have low grade fever, hyperglycemia, bibasilar infiltrates on CXR and covid-19 positive. He is a vague historian and complained of abdominal pain. CT abd/pelvis showed cholelithiasis without ductal dilation or inflammation and no other intraabdominal pathology, but there were GGOs with bibasilar predominance, areas of subpleural sparing, and intralobular septal thickening. He was given azithromycin and ceftriaxone before transfer to [facility] 4/18. Antibiotics stopped as procalcitonin was undetectable. Fevers and inflammatory markers trending upward and hydroxychloroquine started 4/19.    Assessment & Plan: Principal Problem:   Acute respiratory disease due to COVID-19 virus Active Problems:   Type 2 diabetes mellitus without complication (HCC)   Chronic pain   Hyperbilirubinemia   BPH (benign prostatic hyperplasia)  Acute hypoxic respiratory failure due to covid-19 viral pneumonia: Remains at risk of progression to ARDS. - Continue supplemental oxygen to maintain SpO2 >90%. Wean today if able. Hopeful based on clearer lung exam and improved dyspnea. - LDH, CRP better, d-dimer little worse. - PT eval, amb pulse ox ordered.  - Dextromethorphan BID, tessalon TID prn.  - Continue HCQ, monitor QTc (currently QTc ). The use of this medication is off-label, and its efficacy in this clinical situation is not definitively proven. The risks including death by cardiac arrhythmia as well as side effects of rash, LFT elevations, and rarely retinopathy were discussed and the patient chooses to proceed. - Continue droplet, contact precautions. PPE including surgical gown, gloves, face shield, cap, shoe covers, and N-95 used during this encounter in a  negative pressure room.  - Check daily labs: CBC w/diff, CMP, d-dimer, LDH, CRP - Troponin is undetectable. Hep B SAg neg. - Enoxaparin prophylactic dose.  - Maintain euvolemia - Avoid NSAIDs - Goals of care were discussed. Would want trial of intubation.    NIDT2DM with hyperglycemia: HbA1c 10.7% indicating chronically poor control. Suspect higher CBGs in infection as well.  - Lantus 10>15, mealtime 3>5; cont mod SSI, HS correction. - Restart home linagliptin - Carb-mod diet  Hyperbilirubinemia: Resolved. In the absence of other LFT elevations, do not suspect cholelithiasis seen on CT abd/pelvis is clinically salient finding.   BPH:  - Continue flomax, seems to be voiding well, check PVR as needed.  Hypokalemia:  - Supplement and recheck with Mg in AM.  Chronic pain:  - Continue tramadol - Avoid NSAIDs.  Obesity: BMI 31.  - Weight loss recommended long term.  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  DVT prophylaxis: Lovenox Code Status: Full code confirmed Family Communication: Wife updated in person as she is also admitted on this medical floor. Will be co-rooming beginning today. Disposition Plan: Uncertain, will involve PT/OT to minimize loss of strength while hospitalized. May be stable for discharge in next 48 hours.  Consultants:   None  Procedures:   None  Antimicrobials:  Ceftriaxone, azithromycin 4/17  Hydroxychloroquine 4/19 - 4/23    Subjective: Incremental improvement in shortness of breath over past 24 hours, able to get to bathroom with mild-moderate shortness of breath, feels weak/unsteady by himself though which is not his baseline. No chest pain or fevers. Abdominal pain resolved. Eating well, CBGs very high.    Objective: Vitals:   07/08/18 2000 07/09/18 0000 07/09/18 0400 07/09/18 8185  BP:      Pulse:      Resp:      Temp: 98.1 F (36.7 C) 98.9 F (37.2 C) 98.1 F (36.7 C) (!) 97.2 F (36.2 C)  TempSrc: Oral Oral  Oral Oral  SpO2:      Weight:      Height:       Gen: 63 y.o. male in no distress Pulm: Nonlabored on supplemental oxygen at rest. Clear. CV: Regular rate and rhythm. No murmur, rub, or gallop. No JVD, no dependent edema. GI: Abdomen soft, non-tender, non-distended, with normoactive bowel sounds.  Ext: Warm, no deformities Skin: No rashes, lesions or ulcers on visualized skin. No jaundice. Neuro: Alert and oriented. No focal neurological deficits. Psych: Judgement and insight appear fair. Mood euthymic & affect congruent. Behavior is appropriate.    CBC: Recent Labs  Lab 07/05/18 2312 07/06/18 0815 07/07/18 0845 07/08/18 0410 07/09/18 0415  WBC 6.1 5.8 8.6 8.1 4.8  NEUTROABS 4.3  --  6.5 5.8 2.6  HGB 13.9 13.1 13.6 12.4* 12.6*  HCT 42.0 38.7* 40.3 37.4* 39.0  MCV 88.6 89.2 91.0 89.9 90.7  PLT 225 197 222 247 273   Basic Metabolic Panel: Recent Labs  Lab 07/05/18 2312 07/07/18 0845 07/08/18 0410 07/09/18 0415  NA 135 138 137 139  K 3.7 4.1 3.4* 3.7  CL 102 103 103 102  CO2 19* 23 23 27   GLUCOSE 364* 337* 226* 236*  BUN 12 11 10 13   CREATININE 0.99 0.94 0.79 0.78  CALCIUM 8.6* 8.5* 8.3* 8.7*   Liver Function Tests: Recent Labs  Lab 07/05/18 2312 07/07/18 0845 07/08/18 0410 07/09/18 0415  AST 18 19 17 18   ALT 15 14 13 13   ALKPHOS 70 61 52 60  BILITOT 1.4* 1.0 0.7 0.6  PROT 8.1 7.3 7.0 6.9  ALBUMIN 3.7 3.1* 2.9* 2.8*   Cardiac Enzymes: Recent Labs  Lab 07/05/18 2312  TROPONINI <0.03   HbA1C: Recent Labs    07/07/18 0745  HGBA1C 10.7*   CBG: Recent Labs  Lab 07/08/18 0810 07/08/18 1150 07/08/18 1655 07/08/18 2048 07/09/18 0753  GLUCAP 326* 197* 241* 264* 260*   Urine analysis:    Component Value Date/Time   COLORURINE YELLOW (A) 07/05/2018 2312   APPEARANCEUR CLEAR (A) 07/05/2018 2312   LABSPEC 1.025 07/05/2018 2312   PHURINE 5.0 07/05/2018 2312   GLUCOSEU >=500 (A) 07/05/2018 2312   HGBUR SMALL (A) 07/05/2018 2312   BILIRUBINUR  NEGATIVE 07/05/2018 2312   KETONESUR 80 (A) 07/05/2018 2312   PROTEINUR NEGATIVE 07/05/2018 2312   NITRITE NEGATIVE 07/05/2018 2312   LEUKOCYTESUR NEGATIVE 07/05/2018 2312   SARS-CoV2: Positive   LOS: 3 days   Time spent: 35 minutes.  Tyrone Nineyan B Anaisha Mago, MD [facility] [email or web address] Password [email or web address] 07/09/2018, 11:34 AM

## 2018-07-09 NOTE — Evaluation (Signed)
Physical Therapy Evaluation Patient Details Name: Benjamin Castaneda MRN: [id number] DOB: 03-Mar-1956 Today's Date: 07/09/2018   History of Present Illness  Benjamin Castaneda is a 63 y.o. male with a history of T2DM, BPH, and chronic pain who presented to [facility] ED 4/17 with dyspnea, found to have low grade fever, hyperglycemia, bibasilar infiltrates on CXR and covid-19 positive. He is a vague historian and complained of abdominal pain. CT abd/pelvis showed cholelithiasis without ductal dilation   Clinical Impression  The patient presents with deconditioning, decreased balance and requires a RW at present, decreased Oxygen saturation to 84% on RA. Patient currently reports limited assistance at home. Wife will not be  Available and sister has been ill. Continue PT for mobility and ambulation. Pt admitted with above diagnosis. Pt currently with functional limitations due to the deficits listed below (see PT Problem List).  Pt will benefit from skilled PT to increase their independence and safety with mobility to allow discharge to the venue listed below.       Follow Up Recommendations Supervision/Assistance - 24 hour    Equipment Recommendations  Rolling walker with 5" wheels    Recommendations for Other Services       Precautions / Restrictions Precautions Precautions: Fall Precaution Comments: monitor sats and HR      Mobility  Bed Mobility Overal bed mobility: Needs Assistance Bed Mobility: Supine to Sit     Supine to sit: Supervision        Transfers Overall transfer level: Needs assistance Equipment used: Rolling walker (2 wheeled) Transfers: Sit to/from Stand Sit to Stand: Min assist;Mod assist         General transfer comment: assist to rise from low bed and recliner, did stand from toilet withrail without assistance.  Ambulation/Gait Ambulation/Gait assistance: Min assist Gait Distance (Feet): 20 Feet(x2 on 2 L then 4' on RA) Assistive device: Rolling walker (2  wheeled) Gait Pattern/deviations: Step-through pattern;Step-to pattern;Trunk flexed     General Gait Details: patient's gait is  unsteady, shuffling and knees are flexed. Steady assist required throughout ambulation. O2 sats dropped  to  (84% on 2 l. THEN 84% ON ra WHILE AMBUATING. hr UP TO 120'S.  Stairs            Wheelchair Mobility    Modified Rankin (Stroke Patients Only)       Balance Overall balance assessment: Needs assistance Sitting-balance support: No upper extremity supported;Single extremity supported Sitting balance-Leahy Scale: Good     Standing balance support: During functional activity;Bilateral upper extremity supported Standing balance-Leahy Scale: Poor Standing balance comment: when  performing pericare after BM, knees flexed and patient's trunk wavering. steady assist  provided                             Pertinent Vitals/Pain Pain Assessment: No/denies pain    Home Living Family/patient expects to be discharged to:: Private residence Living Arrangements: Spouse/significant other Available Help at Discharge: Family Type of Home: House Home Access: Stairs to enter   Secretary/administratorntrance Stairs-Number of Steps: 1 Home Layout: One level Home Equipment: None      Prior Function Level of Independence: Independent               Hand Dominance        Extremity/Trunk Assessment   Upper Extremity Assessment Upper Extremity Assessment: Defer to OT evaluation    Lower Extremity Assessment Lower Extremity Assessment: Generalized weakness    Cervical /  Trunk Assessment Cervical / Trunk Assessment: Normal  Communication   Communication: No difficulties  Cognition Arousal/Alertness: Awake/alert Behavior During Therapy: WFL for tasks assessed/performed Overall Cognitive Status: Within Functional Limits for tasks assessed                                        General Comments      Exercises General Exercises -  Lower Extremity Long Arc Quad: AROM;Both;10 reps;Seated Hip Flexion/Marching: AROM;Both;10 reps;Seated   Assessment/Plan    PT Assessment Patient needs continued PT services  PT Problem List Decreased strength;Decreased mobility;Decreased safety awareness;Decreased activity tolerance;Cardiopulmonary status limiting activity;Decreased balance;Decreased knowledge of use of DME       PT Treatment Interventions DME instruction;Gait training;Functional mobility training;Therapeutic activities;Therapeutic exercise;Patient/family education    PT Goals (Current goals can be found in the Care Plan section)  Acute Rehab PT Goals Patient Stated Goal: to go home PT Goal Formulation: With patient/family Time For Goal Achievement: 07/23/18 Potential to Achieve Goals: Good    Frequency Min 4X/week   Barriers to discharge Decreased caregiver support wife a patient in hospital, sister also sick.they/he will be home alone    Co-evaluation               AM-PAC PT "6 Clicks" Mobility  Outcome Measure Help needed turning from your back to your side while in a flat bed without using bedrails?: None Help needed moving from lying on your back to sitting on the side of a flat bed without using bedrails?: None Help needed moving to and from a bed to a chair (including a wheelchair)?: A Little Help needed standing up from a chair using your arms (e.g., wheelchair or bedside chair)?: A Lot Help needed to walk in hospital room?: A Lot Help needed climbing 3-5 steps with a railing? : A Lot 6 Click Score: 17    End of Session   Activity Tolerance: Patient limited by fatigue Patient left: in chair;with call bell/phone within reach Nurse Communication: Mobility status PT Visit Diagnosis: Unsteadiness on feet (R26.81)    Time: 6767-2094 PT Time Calculation (min) (ACUTE ONLY): 43 min   Charges:   PT Evaluation $PT Eval Low Complexity: 1 Low PT Treatments $Gait Training: 23-37 mins         Blanchard Kelch PT Acute Rehabilitation Services Pager [phone] Office [phone]   Rada Hay 07/09/2018, 2:03 PM

## 2018-07-09 NOTE — Progress Notes (Signed)
SATURATION QUALIFICATIONS: (This note is used to comply with regulatory documentation for home oxygen)  Patient Saturations on Room Air at Rest = 95%  Patient Saturations on Room Air while Ambulating = 75%  Patient Saturations on 1 Liter of oxygen while Ambulating = 87%  Please briefly explain why patient needs home oxygen:  Patient saturation drops to 75% on RA when ambulating. His saturation was 87% when walking with 1Liter of O2. His saturation while resting on 1 Liter of O2 is 94-95%

## 2018-07-09 NOTE — Progress Notes (Signed)
Called patient's nephew, Genevie Cheshire and gave him an update on patients condition. Answered questions/concerns he had regarding his uncle. Told him to feel free to call if he has any questions regarding his uncle's condition.

## 2018-07-10 LAB — CBC WITH DIFFERENTIAL/PLATELET
Abs Immature Granulocytes: 0.05 10*3/uL (ref 0.00–0.07)
Basophils Absolute: 0 10*3/uL (ref 0.0–0.1)
Basophils Relative: 0 %
Eosinophils Absolute: 0.1 10*3/uL (ref 0.0–0.5)
Eosinophils Relative: 2 %
HCT: 37 % — ABNORMAL LOW (ref 39.0–52.0)
Hemoglobin: 12.2 g/dL — ABNORMAL LOW (ref 13.0–17.0)
Immature Granulocytes: 1 %
Lymphocytes Relative: 34 %
Lymphs Abs: 1.5 10*3/uL (ref 0.7–4.0)
MCH: 29.8 pg (ref 26.0–34.0)
MCHC: 33 g/dL (ref 30.0–36.0)
MCV: 90.2 fL (ref 80.0–100.0)
Monocytes Absolute: 0.3 10*3/uL (ref 0.1–1.0)
Monocytes Relative: 8 %
Neutro Abs: 2.5 10*3/uL (ref 1.7–7.7)
Neutrophils Relative %: 55 %
Platelets: 298 10*3/uL (ref 150–400)
RBC: 4.1 MIL/uL — ABNORMAL LOW (ref 4.22–5.81)
RDW: 12.6 % (ref 11.5–15.5)
WBC: 4.5 10*3/uL (ref 4.0–10.5)
nRBC: 0 % (ref 0.0–0.2)

## 2018-07-10 LAB — COMPREHENSIVE METABOLIC PANEL
ALT: 14 U/L (ref 0–44)
AST: 16 U/L (ref 15–41)
Albumin: 2.9 g/dL — ABNORMAL LOW (ref 3.5–5.0)
Alkaline Phosphatase: 60 U/L (ref 38–126)
Anion gap: 11 (ref 5–15)
BUN: 11 mg/dL (ref 8–23)
CO2: 26 mmol/L (ref 22–32)
Calcium: 8.6 mg/dL — ABNORMAL LOW (ref 8.9–10.3)
Chloride: 103 mmol/L (ref 98–111)
Creatinine, Ser: 0.63 mg/dL (ref 0.61–1.24)
GFR calc Af Amer: >60 mL/min (ref >60)
GFR calc non Af Amer: >60 mL/min (ref >60)
Glucose, Bld: 225 mg/dL — ABNORMAL HIGH (ref 70–99)
Potassium: 3.1 mmol/L — ABNORMAL LOW (ref 3.5–5.1)
Sodium: 140 mmol/L (ref 135–145)
Total Bilirubin: 0.6 mg/dL (ref 0.3–1.2)
Total Protein: 6.9 g/dL (ref 6.5–8.1)

## 2018-07-10 LAB — LACTATE DEHYDROGENASE: LDH: 164 U/L (ref 98–192)

## 2018-07-10 LAB — CULTURE, BLOOD (ROUTINE X 2): Culture: NO GROWTH

## 2018-07-10 LAB — GLUCOSE, CAPILLARY
Glucose-Capillary: 203 mg/dL — ABNORMAL HIGH (ref 70–99)
Glucose-Capillary: 219 mg/dL — ABNORMAL HIGH (ref 70–99)
Glucose-Capillary: 223 mg/dL — ABNORMAL HIGH (ref 70–99)
Glucose-Capillary: 240 mg/dL — ABNORMAL HIGH (ref 70–99)
Glucose-Capillary: 246 mg/dL — ABNORMAL HIGH (ref 70–99)

## 2018-07-10 LAB — D-DIMER, QUANTITATIVE: D-Dimer, Quant: 1.11 ug/mL-FEU — ABNORMAL HIGH (ref 0.00–0.50)

## 2018-07-10 LAB — C-REACTIVE PROTEIN: CRP: 6.6 mg/dL — ABNORMAL HIGH (ref <1.0)

## 2018-07-10 MED ORDER — INSULIN ASPART 100 UNIT/ML ~~LOC~~ SOLN
6.0000 [IU] | Freq: Three times a day (TID) | SUBCUTANEOUS | Status: DC
  Administered 2018-07-10 – 2018-07-11 (×4): 6 [IU] via SUBCUTANEOUS

## 2018-07-10 MED ORDER — MAGNESIUM SULFATE IN D5W 1-5 GM/100ML-% IV SOLN
1.0000 g | Freq: Once | INTRAVENOUS | Status: AC
  Administered 2018-07-10: 1 g via INTRAVENOUS
  Filled 2018-07-10: qty 100

## 2018-07-10 MED ORDER — POTASSIUM CHLORIDE CRYS ER 20 MEQ PO TBCR
40.0000 meq | EXTENDED_RELEASE_TABLET | Freq: Once | ORAL | Status: AC
  Administered 2018-07-10: 09:00:00 40 meq via ORAL
  Filled 2018-07-10: qty 2

## 2018-07-10 MED ORDER — INSULIN GLARGINE 100 UNIT/ML ~~LOC~~ SOLN
20.0000 [IU] | Freq: Every day | SUBCUTANEOUS | Status: DC
  Administered 2018-07-10 – 2018-07-11 (×2): 20 [IU] via SUBCUTANEOUS
  Filled 2018-07-10 (×2): qty 0.2

## 2018-07-10 NOTE — Progress Notes (Signed)
Physical Therapy Treatment Patient Details Name: Benjamin Castaneda MRN: [id number] DOB: 04-Apr-1955 Today's Date: 07/10/2018    History of Present Illness Benjamin Castaneda is a 63 y.o. male with a history of T2DM, BPH, and chronic pain who presented to [facility] ED 4/17 with dyspnea, found to have low grade fever, hyperglycemia, bibasilar infiltrates on CXR and covid-19 positive. He is a vague historian and complained of abdominal pain. CT abd/pelvis showed cholelithiasis without ductal dilation     PT Comments    Patient ambulated x 100' on RA with saturation > 90%.  HR 110. Patient does require a RW at this time for stability and balance. Will assess without AD next visit.  See O2 saturation qualification note.  Follow Up Recommendations  Supervision/Assistance - 24 hour;Home health PT     Equipment Recommendations  Rolling walker with 5" wheels    Recommendations for Other Services       Precautions / Restrictions Precautions Precautions: Fall Precaution Comments: monitor sats and HR    Mobility  Bed Mobility Overal bed mobility: Modified Independent                Transfers Overall transfer level: Needs assistance Equipment used: Rolling walker (2 wheeled) Transfers: Sit to/from Stand Sit to Stand: Min guard;From elevated surface         General transfer comment: extra try to stand from  lower bed  Ambulation/Gait Ambulation/Gait assistance: Min assist Gait Distance (Feet): 100 Feet Assistive device: Rolling walker (2 wheeled) Gait Pattern/deviations: Step-to pattern Gait velocity: decr   General Gait Details: patient used RW as SW, gait  presents with slower speed than normal, no balance  loss but does rely on RW for stability   Stairs             Wheelchair Mobility    Modified Rankin (Stroke Patients Only)       Balance Overall balance assessment: Needs assistance   Sitting balance-Leahy Scale: Good     Standing balance support: During  functional activity;Bilateral upper extremity supported;No upper extremity supported Standing balance-Leahy Scale: Poor Standing balance comment: patient stood to self don mask without UE support which was unsteady, required slight external support, slight sway backwards                            Cognition Arousal/Alertness: Awake/alert                                            Exercises      General Comments        Pertinent Vitals/Pain Pain Assessment: No/denies pain    Home Living                      Prior Function            PT Goals (current goals can now be found in the care plan section) Progress towards PT goals: Progressing toward goals    Frequency    Min 4X/week      PT Plan Current plan remains appropriate    Co-evaluation              AM-PAC PT "6 Clicks" Mobility   Outcome Measure  Help needed turning from your back to your side while in a flat bed without using bedrails?: None Help  needed moving from lying on your back to sitting on the side of a flat bed without using bedrails?: None Help needed moving to and from a bed to a chair (including a wheelchair)?: A Little Help needed standing up from a chair using your arms (e.g., wheelchair or bedside chair)?: A Little Help needed to walk in hospital room?: A Lot Help needed climbing 3-5 steps with a railing? : A Lot 6 Click Score: 18    End of Session Equipment Utilized During Treatment: Gait belt Activity Tolerance: Patient tolerated treatment well Patient left: in chair;with call bell/phone within reach Nurse Communication: Mobility status PT Visit Diagnosis: Unsteadiness on feet (R26.81)     Time: 1035-1110 PT Time Calculation (min) (ACUTE ONLY): 35 min  Charges:  $Gait Training: 23-37 mins                     .Blanchard KelchKaren Larra Crunkleton PT Acute Rehabilitation Services Pager [phone] Office [phone]     Rada HayHill, Mayanna Garlitz Elizabeth 07/10/2018,  12:00 PM

## 2018-07-10 NOTE — Progress Notes (Addendum)
Inpatient Diabetes Program Recommendations  AACE/ADA: New Consensus Statement on Inpatient Glycemic Control   Target Ranges:  Prepandial:   less than 140 mg/dL      Peak postprandial:   less than 180 mg/dL (1-2 hours)      Critically ill patients:  140 - 180 mg/dL  Results for Benjamin Castaneda, Benjamin Castaneda (MRN [id number]) as of 07/10/2018 09:37  Ref. Range 07/09/2018 07:53 07/09/2018 11:46 07/09/2018 16:37 07/09/2018 20:43 07/10/2018 00:26 07/10/2018 07:50  Glucose-Capillary Latest Ref Range: 70 - 99 mg/dL 624 (H) 469 (H) 507 (H) 235 (H) 240 (H) 246 (H)   Results for Benjamin Castaneda, Benjamin Castaneda (MRN [id number]) as of 07/10/2018 09:37  Ref. Range 07/07/2018 07:45  Hemoglobin A1C Latest Ref Range: 4.8 - 5.6 % 10.7 (H)   Review of Glycemic Control  Diabetes history: DM2 Outpatient Diabetes medications: Glipizide XL 20 mg daily, Metformin 1000 mg BID, Tradjenta 5 mg daily Current orders for Inpatient glycemic control: Lantus 15 units daily, Novolog 5 units TID with meals, Novolog 0-15 units TID with meals, Novolog 0-5 units QHS, Tradjenta 5 mg daily  Inpatient Diabetes Program Recommendations:   Insulin - Basal: Please consider increasing Lantus to 20 units daily starting today.  HgbA1C: A1C 10.7% on 07/07/18 indicating an average glucose of 260 mg/dl over the past 2-3 months.  Addendum 07/10/18@11 :46-Spoke with patient over the phone about diabetes and home regimen for diabetes control. Patient reports that he takes Glipizide XL 20 mg daily, Metformin 1000 mg BID, Tradjenta 5 mg daily as an outpatient for diabetes control. Patient reports that he is taking DM medications as prescribed.   Patient states that his glucose has been 130-mid 200's mg/dl over the past 1-2 weeks.  Inquired about prior A1C and patient reports that he does not recall his last A1C value. Discussed A1C results (10.7%% on 07/07/18) and explained that his current A1C indicates an average glucose of 260 mg/dl over the past 2-3 months. Discussed glucose and A1C  goals. Discussed importance of checking CBGs and maintaining good CBG control to prevent long-term and short-term complications.  Discussed impact of nutrition, exercise, stress, sickness, and medications on diabetes control. Patient denies any recent steroid use. Encouraged patient to check his glucose 3-4 times per day and to follow up with PCP regarding DM control.  Patient verbalized understanding of information discussed and he states that he has no further questions at this time related to diabetes.  Thanks, Orlando Penner, RN, MSN, CDE Diabetes Coordinator Inpatient Diabetes Program [phone] (Team Pager from 8am to 5pm)

## 2018-07-10 NOTE — Progress Notes (Signed)
SATURATION QUALIFICATIONS: (This note is used to comply with regulatory documentation for home oxygen)  Patient Saturations on Room Air at Rest =95%  Patient Saturations on Room Air while Ambulating = 91%  Patient Saturations on Liters of oxygen while Ambulating = NT  Please briefly explain why patient needs home oxygen:Patient does not require O2 based on this assessment. Blanchard Kelch PT Acute Rehabilitation Services Pager [phone] Office [phone]

## 2018-07-10 NOTE — Progress Notes (Addendum)
PROGRESS NOTE  [name]  BJY:[id number] DOB: 06/27/1955 DOA: 07/06/2018 PCP: [facility], [facility]   Brief Narrative: Benjamin Castaneda is a 63 y.o. male with a history of T2DM, BPH, and chronic pain who presented to [facility] ED 4/17 with dyspnea, found to have low grade fever, hyperglycemia, bibasilar infiltrates on CXR and covid-19 positive. He is a vague historian and complained of abdominal pain. CT abd/pelvis showed cholelithiasis without ductal dilation or inflammation and no other intraabdominal pathology, but there were GGOs with bibasilar predominance, areas of subpleural sparing, and intralobular septal thickening. He was given azithromycin and ceftriaxone before transfer to [facility] 4/18. Antibiotics stopped as procalcitonin was undetectable. Fevers and inflammatory markers trending upward and hydroxychloroquine started 4/19.    Assessment & Plan: Principal Problem:   Acute respiratory disease due to COVID-19 virus Active Problems:   Type 2 diabetes mellitus without complication (HCC)   Chronic pain   Hyperbilirubinemia   BPH (benign prostatic hyperplasia)  Acute hypoxic respiratory failure due to covid-19 viral pneumonia: Remains at risk of progression to ARDS. - Continue supplemental oxygen to maintain SpO2 >90%. Wean as able. - PT evaluation to continue. Requiring walker and assistance with ambulation.  - Dextromethorphan BID, tessalon TID prn.  - Continue HCQ 4/19 - 4/23, monitor QTc (currently QTc ). The use of this medication is off-label, and its efficacy in this clinical situation is not definitively proven. The risks including death by cardiac arrhythmia as well as side effects of rash, LFT elevations, and rarely retinopathy were discussed and the patient chooses to proceed. - Continue droplet, contact precautions. PPE including surgical gown, gloves, face shield, cap, shoe covers, and N-95 used during this encounter in a negative pressure room.  - Check daily labs:  CBC w/diff, CMP, d-dimer, LDH, CRP. Markers improving. - Troponin is undetectable. Hep B SAg neg. - Enoxaparin prophylactic dose.  - Maintain euvolemia - Avoid NSAIDs - Goals of care were discussed. Would want trial of intubation.    Anemia of acute disease: Hgb stable >12g/dl  - Recheck   YQMV7QI with hyperglycemia: HbA1c 10.7% indicating chronically poor control. Suspect higher CBGs in infection as well.  - Lantus 10>>20 today , mealtime 3>>6 today; cont mod SSI, HS correction. - Restarted home linagliptin. Will need PCP follow up and initiation of insulin pending continued CBG trends. - Carb-mod diet  Hyperbilirubinemia: Resolved. In the absence of other LFT elevations, do not suspect cholelithiasis seen on CT abd/pelvis is clinically salient finding.   BPH:  - Continue flomax, seems to be voiding well, check PVR as needed.  Hypokalemia:  - Supplement and empirically give mag, will recheck with Mg in AM.  Chronic pain:  - Continue tramadol - Avoid NSAIDs.  Obesity: BMI 31.  - Weight loss recommended long term.  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  DVT prophylaxis: Lovenox Code Status: Full code confirmed Family Communication: Wife updated in person in the room. Disposition Plan: Anticipate DC home w/wife in next 24 hours.  Consultants:   None  Procedures:   None  Antimicrobials:  Ceftriaxone, azithromycin 4/17  Hydroxychloroquine 4/19 - 4/23    Subjective: Feels better but desaturated significantly with exertion yesterday. CBGs continue to be elevated. No fevers or chest pain or palpitations. Wished him happy birthday.  Objective: Vitals:   07/09/18 1635 07/09/18 2040 07/10/18 0024 07/10/18 0444  BP:  121/80 113/76 121/70  Pulse:      Resp:   (!) 23  Temp: 98.3 F (36.8 C) 98.6 F (37 C) 98.4 F (36.9 C) 98.1 F (36.7 C)  TempSrc: Oral Oral Oral Oral  SpO2:      Weight:      Height:       Gen: 63 y.o. male in no distress  Pulm: Nonlabored breathing supplemental oxygen. Clear. CV: Regular rate and rhythm. No murmur, rub, or gallop. No JVD, no dependent edema. GI: Abdomen soft, non-tender, non-distended, with normoactive bowel sounds.  Ext: Warm, no deformities Skin: No rashes, lesions or ulcers on visualized skin. Neuro: Alert and oriented. No focal neurological deficits. Psych: Judgement and insight appear fair. Mood euthymic & affect congruent. Behavior is appropriate.   CBC: Recent Labs  Lab 07/05/18 2312 07/06/18 0815 07/07/18 0845 07/08/18 0410 07/09/18 0415 07/10/18 0500  WBC 6.1 5.8 8.6 8.1 4.8 4.5  NEUTROABS 4.3  --  6.5 5.8 2.6 2.5  HGB 13.9 13.1 13.6 12.4* 12.6* 12.2*  HCT 42.0 38.7* 40.3 37.4* 39.0 37.0*  MCV 88.6 89.2 91.0 89.9 90.7 90.2  PLT 225 197 222 247 273 298   Basic Metabolic Panel: Recent Labs  Lab 07/05/18 2312 07/07/18 0845 07/08/18 0410 07/09/18 0415 07/10/18 0500  NA 135 138 137 139 140  K 3.7 4.1 3.4* 3.7 3.1*  CL 102 103 103 102 103  CO2 19* 23 23 27 26   GLUCOSE 364* 337* 226* 236* 225*  BUN 12 11 10 13 11   CREATININE 0.99 0.94 0.79 0.78 0.63  CALCIUM 8.6* 8.5* 8.3* 8.7* 8.6*   Liver Function Tests: Recent Labs  Lab 07/05/18 2312 07/07/18 0845 07/08/18 0410 07/09/18 0415 07/10/18 0500  AST 18 19 17 18 16   ALT 15 14 13 13 14   ALKPHOS 70 61 52 60 60  BILITOT 1.4* 1.0 0.7 0.6 0.6  PROT 8.1 7.3 7.0 6.9 6.9  ALBUMIN 3.7 3.1* 2.9* 2.8* 2.9*   Cardiac Enzymes: Recent Labs  Lab 07/05/18 2312  TROPONINI <0.03   HbA1C: Lab Results  Component Value Date   HGBA1C 10.7 (H) 07/07/2018    CBG: Recent Labs  Lab 07/09/18 1146 07/09/18 1637 07/09/18 2043 07/10/18 0026 07/10/18 0750  GLUCAP 205* 149* 235* 240* 246*   Urine analysis:    Component Value Date/Time   COLORURINE YELLOW (A) 07/05/2018 2312   APPEARANCEUR CLEAR (A) 07/05/2018 2312   LABSPEC 1.025 07/05/2018 2312   PHURINE 5.0 07/05/2018 2312   GLUCOSEU >=500 (A) 07/05/2018 2312    HGBUR SMALL (A) 07/05/2018 2312   BILIRUBINUR NEGATIVE 07/05/2018 2312   KETONESUR 80 (A) 07/05/2018 2312   PROTEINUR NEGATIVE 07/05/2018 2312   NITRITE NEGATIVE 07/05/2018 2312   LEUKOCYTESUR NEGATIVE 07/05/2018 2312   SARS-CoV2: Positive   LOS: 4 days   Time spent: 35 minutes.  Tyrone Nineyan B Windle Huebert, MD [facility] [email or web address] Password [email or web address] 07/10/2018, 9:53 AM

## 2018-07-10 NOTE — Evaluation (Signed)
Occupational Therapy Evaluation Patient Details Name: Benjamin Castaneda MRN: [id number] DOB: 09/03/1955 Today's Date: 07/10/2018    History of Present Illness Benjamin Castaneda is a 63 y.o. male with a history of T2DM, BPH, and chronic pain who presented to [facility] ED 4/17 with dyspnea, found to have low grade fever, hyperglycemia, bibasilar infiltrates on CXR and covid-19 positive. He is a vague historian and complained of abdominal pain. CT abd/pelvis showed cholelithiasis without ductal dilation    Clinical Impression   PTA, pt was living with his wife and was independent; performed marjority if house work at home. Pt currently requiring Min Guard A for grooming at sink, Min A for LB ADLs, and Min A for functional mobility with RW. Pt presenting with poor cognition, balance, strength, and activity tolerance. Pt also presenting with decreased awareness of deficits and safety. Pt and his wife reporting they do not have family to assist at home on a regular basis. Due to decreased awareness, balance, and activity tolerance, pt is at a high risk for falls. Pt would benefit from further acute OT to facilitate safe dc. Pending pt progress, recommend dc to SNF for further OT to optimize safety, independence with ADLs, and return to PLOF.   HR 104-120. SpO2 96-92% on RA. RR 36 with activity.      Follow Up Recommendations  SNF;Supervision/Assistance - 24 hour ; Pending progress, pt may be able to dc home with HHOT.   Equipment Recommendations  Tub/shower seat    Recommendations for Other Services PT consult     Precautions / Restrictions Precautions Precautions: Fall Precaution Comments: monitor sats and HR Restrictions Weight Bearing Restrictions: No      Mobility Bed Mobility Overal bed mobility: Modified Independent             General bed mobility comments: Increased time and effort  Transfers Overall transfer level: Needs assistance Equipment used: Rolling walker (2  wheeled);None Transfers: Sit to/from Stand Sit to Stand: Min guard;From elevated surface         General transfer comment: Min Guard A for safety.     Balance Overall balance assessment: Needs assistance Sitting-balance support: No upper extremity supported;Single extremity supported Sitting balance-Leahy Scale: Good     Standing balance support: During functional activity;Bilateral upper extremity supported;No upper extremity supported Standing balance-Leahy Scale: Poor Standing balance comment: patient stood to self don mask without UE support which was unsteady, required slight external support, slight sway backwards                           ADL either performed or assessed with clinical judgement   ADL Overall ADL's : Needs assistance/impaired Eating/Feeding: Set up;Supervision/ safety;Sitting   Grooming: Min guard;Oral care;Standing Grooming Details (indicate cue type and reason): Pt performing oral care at the sink with Min Guard A for safety. Pt also requiring cues for sequencing and asking basic questions such as "is this the toothpaste?" or "which one of these is cold water" (pointing to the faucet) Upper Body Bathing: Sitting;Min guard   Lower Body Bathing: Minimal assistance;Sit to/from stand   Upper Body Dressing : Min guard;Sitting   Lower Body Dressing: Minimal assistance;Sit to/from stand Lower Body Dressing Details (indicate cue type and reason): Pt able to bring ankles to knees for adjusting socks. Min A for dynamic standing balance Toilet Transfer: Minimal assistance;Ambulation;RW(simulated in room) Toilet Transfer Details (indicate cue type and reason): Min A for safety in standing  Functional mobility during ADLs: Minimal assistance;Rolling walker General ADL Comments: Pt presenting with decreased safety, balance, strength, and activity tolerance. Pt with poor awarness of his deficits. Pt performing ~5 laps to window and door; pt  quickly fatigues and has poor awarness of decreased toelrance and balance     Vision Baseline Vision/History: Wears glasses Wears Glasses: At all times Patient Visual Report: No change from baseline       Perception     Praxis      Pertinent Vitals/Pain Pain Assessment: No/denies pain     Hand Dominance     Extremity/Trunk Assessment Upper Extremity Assessment Upper Extremity Assessment: Generalized weakness   Lower Extremity Assessment Lower Extremity Assessment: Generalized weakness   Cervical / Trunk Assessment Cervical / Trunk Assessment: Normal   Communication Communication Communication: No difficulties   Cognition Arousal/Alertness: Awake/alert Behavior During Therapy: WFL for tasks assessed/performed Overall Cognitive Status: Impaired/Different from baseline                             Awareness: Emergent Problem Solving: Slow processing;Requires verbal cues General Comments: Pt with decreased problem solving and awarness. Feel pt is close to baseline. Requiring increased time and cues. Poor safety awareness. Very agreeable to therapy   General Comments  RR 36 with activity. HR 104-120. SpO2 96-92% on RA    Exercises     Shoulder Instructions      Home Living Family/patient expects to be discharged to:: Private residence Living Arrangements: Spouse/significant other Available Help at Discharge: Family Type of Home: House Home Access: Stairs to enter Secretary/administrator of Steps: 2-3   Home Layout: One level     Bathroom Shower/Tub: Chief Strategy Officer: Standard     Home Equipment: None          Prior Functioning/Environment Level of Independence: Independent                 OT Problem List: Decreased strength;Decreased range of motion;Decreased activity tolerance;Impaired balance (sitting and/or standing);Decreased knowledge of use of DME or AE;Decreased knowledge of precautions;Decreased safety  awareness;Decreased cognition      OT Treatment/Interventions: Therapeutic exercise;Self-care/ADL training;Energy conservation;DME and/or AE instruction;Therapeutic activities;Patient/family education    OT Goals(Current goals can be found in the care plan section) Acute Rehab OT Goals Patient Stated Goal: "Sit on my porch and watch the cars go by." OT Goal Formulation: With patient Time For Goal Achievement: 07/24/18 Potential to Achieve Goals: Good  OT Frequency: Min 3X/week   Barriers to D/C:            Co-evaluation              AM-PAC OT "6 Clicks" Daily Activity     Outcome Measure Help from another person eating meals?: None Help from another person taking care of personal grooming?: A Little Help from another person toileting, which includes using toliet, bedpan, or urinal?: A Little Help from another person bathing (including washing, rinsing, drying)?: A Little Help from another person to put on and taking off regular upper body clothing?: A Little Help from another person to put on and taking off regular lower body clothing?: A Little 6 Click Score: 19   End of Session Equipment Utilized During Treatment: Rolling walker;Gait belt Nurse Communication: Mobility status  Activity Tolerance: Patient tolerated treatment well;Patient limited by fatigue Patient left: in bed;with call bell/phone within reach  OT Visit Diagnosis: Unsteadiness on feet (R26.81);Other abnormalities  of gait and mobility (R26.89);Muscle weakness (generalized) (M62.81)                Time: 1610-96041456-1531 OT Time Calculation (min): 35 min Charges:  OT General Charges $OT Visit: 1 Visit OT Evaluation $OT Eval Moderate Complexity: 1 Mod OT Treatments $Self Care/Home Management : 8-22 mins  Benjamin Castaneda MSOT, Benjamin Castaneda Acute Rehab Pager: [phone] Office: [phone]  Theodoro GristCharis M Yameli Castaneda 07/10/2018, 4:39 PM

## 2018-07-10 NOTE — Progress Notes (Signed)
Physical Therapy Treatment Patient Details Name: Benjamin Castaneda MRN: [id number] DOB: May 24, 1955 Today's Date: 07/10/2018    History of Present Illness Benjamin Castaneda is a 63 y.o. male with a history of T2DM, BPH, and chronic pain who presented to [facility] ED 4/17 with dyspnea, found to have low grade fever, hyperglycemia, bibasilar infiltrates on CXR and covid-19 positive. He is a vague historian and complained of abdominal pain. CT abd/pelvis showed cholelithiasis without ductal dilation     PT Comments    The patient provided balance challenges activities during ambulation with noted decreased balance. Patient reliant on RW for stability. Patient is fall risk. Patient relates that family support limited given all precautions required at home. Uncertain of Rehab availability versus PT intervention increase while in acute care for balance and endurance. Dr. Jarvis Newcomer sent a text message re: PT concerns. Continue PT in AM.  Follow Up Recommendations  SNF;Home health PT(if remains unsteady)     Equipment Recommendations  Rolling walker with 5" wheels    Recommendations for Other Services       Precautions / Restrictions Precautions Precautions: Fall Precaution Comments: monitor sats and HR Restrictions Weight Bearing Restrictions: No    Mobility  Bed Mobility Overal bed mobility: Modified Independent       Supine to sit: Supervision     General bed mobility comments: Increased time and effort, slow and extra effort to get self upright  Transfers Overall transfer level: Needs assistance Equipment used: Rolling walker (2 wheeled);None Transfers: Sit to/from Stand Sit to Stand: Min guard;From elevated surface         General transfer comment: requires several attmpts to stand up.  Ambulation/Gait Ambulation/Gait assistance: Min assist Gait Distance (Feet): 160 Feet Assistive device: Rolling walker (2 wheeled) Gait Pattern/deviations: Step-to pattern Gait velocity: decr    General Gait Details: patient used RW as SW, gait  presents with slower speed than normal,  Patient required to open doors, side step, noted very slow and decreased balance when  let go of RW to manipulate door. Noted dyspnea with increased distance., sats 91%.   Stairs             Wheelchair Mobility    Modified Rankin (Stroke Patients Only)       Balance Overall balance assessment: Needs assistance Sitting-balance support: No upper extremity supported;Single extremity supported Sitting balance-Leahy Scale: Good     Standing balance support: During functional activity;Bilateral upper extremity supported;No upper extremity supported Standing balance-Leahy Scale: Poor Standing balance comment: relies on the RW for stability. Decreasaed balance when let go of RW to open  a door and side step out of the way.                            Cognition Arousal/Alertness: Awake/alert Behavior During Therapy: WFL for tasks assessed/performed Overall Cognitive Status: Impaired/Different from baseline Area of Impairment: Problem solving;Awareness                           Awareness: Emergent Problem Solving: Slow processing;Requires verbal cues General Comments: Pt with decreased problem solving and awarness. Feel pt is close to baseline. Requiring increased time and cues. Poor safety awareness. Very agreeable to therapy      Exercises      General Comments General comments (skin integrity, edema, etc.): RR 36 with activity. HR 104-120. SpO2 96-92% on RA      Pertinent  Vitals/Pain Pain Assessment: No/denies pain    Home Living Family/patient expects to be discharged to:: Private residence Living Arrangements: Spouse/significant other Available Help at Discharge: Family Type of Home: House Home Access: Stairs to enter   Home Layout: One level Home Equipment: None      Prior Function Level of Independence: Independent      Comments: Pt reports  he performed ADLs, IADLs, driving, and did majority of house work.   PT Goals (current goals can now be found in the care plan section) Acute Rehab PT Goals Patient Stated Goal: "Sit on my porch and watch the cars go by." Progress towards PT goals: Progressing toward goals    Frequency    Min 4X/week      PT Plan Discharge plan needs to be updated    Co-evaluation              AM-PAC PT "6 Clicks" Mobility   Outcome Measure  Help needed turning from your back to your side while in a flat bed without using bedrails?: None Help needed moving from lying on your back to sitting on the side of a flat bed without using bedrails?: None Help needed moving to and from a bed to a chair (including a wheelchair)?: A Little Help needed standing up from a chair using your arms (e.g., wheelchair or bedside chair)?: A Little Help needed to walk in hospital room?: A Lot Help needed climbing 3-5 steps with a railing? : A Lot 6 Click Score: 18    End of Session Equipment Utilized During Treatment: Gait belt Activity Tolerance: Patient tolerated treatment well Patient left: in bed;with family/visitor present;with call bell/phone within reach Nurse Communication: Mobility status PT Visit Diagnosis: Unsteadiness on feet (R26.81)     Time: [id number] PT Time Calculation (min) (ACUTE ONLY): 26 min  Charges:  $Gait Training: 23-37 mins                     Blanchard KelchKaren Addison Whidbee PT Acute Rehabilitation Services Pager [phone] Office [phone]    Rada HayHill, Evolette Pendell Elizabeth 07/10/2018, 4:43 PM

## 2018-07-11 LAB — CULTURE, BLOOD (ROUTINE X 2): Culture: NO GROWTH

## 2018-07-11 LAB — COMPREHENSIVE METABOLIC PANEL
ALT: 15 U/L (ref 0–44)
AST: 17 U/L (ref 15–41)
Albumin: 2.8 g/dL — ABNORMAL LOW (ref 3.5–5.0)
Alkaline Phosphatase: 70 U/L (ref 38–126)
Anion gap: 9 (ref 5–15)
BUN: 12 mg/dL (ref 8–23)
CO2: 26 mmol/L (ref 22–32)
Calcium: 8.5 mg/dL — ABNORMAL LOW (ref 8.9–10.3)
Chloride: 104 mmol/L (ref 98–111)
Creatinine, Ser: 0.68 mg/dL (ref 0.61–1.24)
GFR calc Af Amer: >60 mL/min (ref >60)
GFR calc non Af Amer: >60 mL/min (ref >60)
Glucose, Bld: 261 mg/dL — ABNORMAL HIGH (ref 70–99)
Potassium: 3.3 mmol/L — ABNORMAL LOW (ref 3.5–5.1)
Sodium: 139 mmol/L (ref 135–145)
Total Bilirubin: 0.6 mg/dL (ref 0.3–1.2)
Total Protein: 6.9 g/dL (ref 6.5–8.1)

## 2018-07-11 LAB — CBC WITH DIFFERENTIAL/PLATELET
Abs Immature Granulocytes: 0.05 10*3/uL (ref 0.00–0.07)
Basophils Absolute: 0 10*3/uL (ref 0.0–0.1)
Basophils Relative: 1 %
Eosinophils Absolute: 0.1 10*3/uL (ref 0.0–0.5)
Eosinophils Relative: 2 %
HCT: 35.3 % — ABNORMAL LOW (ref 39.0–52.0)
Hemoglobin: 11.7 g/dL — ABNORMAL LOW (ref 13.0–17.0)
Immature Granulocytes: 1 %
Lymphocytes Relative: 38 %
Lymphs Abs: 1.6 10*3/uL (ref 0.7–4.0)
MCH: 30.1 pg (ref 26.0–34.0)
MCHC: 33.1 g/dL (ref 30.0–36.0)
MCV: 90.7 fL (ref 80.0–100.0)
Monocytes Absolute: 0.4 10*3/uL (ref 0.1–1.0)
Monocytes Relative: 11 %
Neutro Abs: 1.9 10*3/uL (ref 1.7–7.7)
Neutrophils Relative %: 47 %
Platelets: 339 10*3/uL (ref 150–400)
RBC: 3.89 MIL/uL — ABNORMAL LOW (ref 4.22–5.81)
RDW: 12.6 % (ref 11.5–15.5)
WBC: 4.1 10*3/uL (ref 4.0–10.5)
nRBC: 0 % (ref 0.0–0.2)

## 2018-07-11 LAB — GLUCOSE, CAPILLARY
Glucose-Capillary: 256 mg/dL — ABNORMAL HIGH (ref 70–99)
Glucose-Capillary: 250 mg/dL — ABNORMAL HIGH (ref 70–99)

## 2018-07-11 LAB — C-REACTIVE PROTEIN: CRP: 4.9 mg/dL — ABNORMAL HIGH (ref <1.0)

## 2018-07-11 LAB — MAGNESIUM: Magnesium: 1.9 mg/dL (ref 1.7–2.4)

## 2018-07-11 LAB — D-DIMER, QUANTITATIVE: D-Dimer, Quant: 1.01 ug/mL-FEU — ABNORMAL HIGH (ref 0.00–0.50)

## 2018-07-11 LAB — LACTATE DEHYDROGENASE: LDH: 160 U/L (ref 98–192)

## 2018-07-11 MED ORDER — POTASSIUM CHLORIDE CRYS ER 20 MEQ PO TBCR
40.0000 meq | EXTENDED_RELEASE_TABLET | Freq: Once | ORAL | Status: AC
  Administered 2018-07-11: 09:00:00 40 meq via ORAL
  Filled 2018-07-11: qty 2

## 2018-07-11 MED ORDER — ACETAMINOPHEN 325 MG PO TABS: 325.0000 mg | ORAL_TABLET | Freq: Four times a day (QID) | ORAL | 0 refills | Status: AC | PRN

## 2018-07-11 NOTE — Progress Notes (Signed)
Occupational Therapy Treatment Patient Details Name: Benjamin KinnierJames T Lieder MRN: [id number] DOB: July 11, 1955 Today's Date: 07/11/2018    History of present illness Benjamin Castaneda is a 63 y.o. male with a history of T2DM, BPH, and chronic pain who [facility] with dyspnea, found to have low grade fever, hyperglycemia, bibasilar infiltrates on CXR and covid-19 positive. He is a vague historian and complained of abdominal pain. CT abd/pelvis showed cholelithiasis without ductal dilation    OT comments  Pt presents seated in recliner agreeable to therapy session. Pt performing functional mobility using RW with overall close minguard assist during session, intermittent minA as pt remains slightly unsteady on his feet. Pt performing standing grooming ADL with minguard assist, demonstrates LB ADL with minguard-minA throughout. Discussion held regarding energy conservation as well as safety strategies to reduce risk for falls after return home and to maximize his overall endurance during functional tasks. Pt verbalizing understanding throughout, though question carryover/compliance as pt demonstrating some delayed processing and overall decreased awareness of his current deficits. Have updated discharge recommendations as pt now returning home vs SNF. [facility] his safety and independence with ADL and mobility. He is anticipating d/c home later today.    Follow Up Recommendations  Home health OT;Supervision/Assistance - 24 hour    Equipment Recommendations  Tub/shower bench          Precautions / Restrictions Precautions Precautions: Fall Precaution Comments: monitor sats and HR Restrictions Weight Bearing Restrictions: No       Mobility Bed Mobility               General bed mobility comments: received up in recliner  Transfers Overall transfer level: Needs assistance Equipment used: Rolling walker (2 wheeled) Transfers: Sit to/from  Stand Sit to Stand: Min guard         General transfer comment: VCs for safe hand placement; stood from recliner x2 during session    Balance Overall balance assessment: Needs assistance   Sitting balance-Leahy Scale: Good     Standing balance support: Bilateral upper extremity supported;During functional activity Standing balance-Leahy Scale: Poor Standing balance comment: reliant on UE support for dynamic mobility                           ADL either performed or assessed with clinical judgement   ADL Overall ADL's : Needs assistance/impaired     Grooming: Min guard;Wash/dry hands;Standing Grooming Details (indicate cue type and reason): standing at sink             Lower Body Dressing: Min guard;Minimal assistance;Sit to/from stand Lower Body Dressing Details (indicate cue type and reason): pt utilizing figure 4 technique to adjust socks seated in recliner; overall close minguard-light minA for standing balance             Functional mobility during ADLs: Min guard;Minimal assistance;Rolling walker       Vision       Perception     Praxis      Cognition Arousal/Alertness: Awake/alert Behavior During Therapy: WFL for tasks assessed/performed Overall Cognitive Status: Impaired/Different from baseline Area of Impairment: Problem solving;Awareness;Safety/judgement                         Safety/Judgement: Decreased awareness of safety;Decreased awareness of deficits Awareness: Emergent Problem Solving: Slow processing;Requires verbal cues General Comments: pt with decreased awareness of current deficits and  requires cues for safety during functional tasks, suspect this may likely be his baseline        Exercises     Shoulder Instructions       General Comments      Pertinent Vitals/ Pain       Pain Assessment: No/denies pain  Home Living                                          Prior  Functioning/Environment              Frequency  Min 3X/week        Progress Toward Goals  OT Goals(current goals can now be found in the care plan section)  Progress towards OT goals: Progressing toward goals  Acute Rehab OT Goals Patient Stated Goal: return home today OT Goal Formulation: With patient Time For Goal Achievement: 07/24/18 Potential to Achieve Goals: Good  Plan Discharge plan needs to be updated    Co-evaluation                 AM-PAC OT "6 Clicks" Daily Activity     Outcome Measure   Help from another person eating meals?: None Help from another person taking care of personal grooming?: A Little Help from another person toileting, which includes using toliet, bedpan, or urinal?: A Little Help from another person bathing (including washing, rinsing, drying)?: A Little Help from another person to put on and taking off regular upper body clothing?: A Little Help from another person to put on and taking off regular lower body clothing?: A Little 6 Click Score: 19    End of Session Equipment Utilized During Treatment: Rolling walker  OT Visit Diagnosis: Unsteadiness on feet (R26.81);Other abnormalities of gait and mobility (R26.89);Muscle weakness (generalized) (M62.81)   Activity Tolerance Patient tolerated treatment well   Patient Left in chair;with call bell/phone within reach;with nursing/sitter in room   Nurse Communication Mobility status        Time: 1537-1550 OT Time Calculation (min): 13 min  Charges: OT General Charges $OT Visit: 1 Visit OT Treatments $Self Care/Home Management : 8-22 mins  Marcy Siren, OT Supplemental Rehabilitation Services Pager [phone] Office [phone]    Orlando Penner 07/11/2018, 4:22 PM

## 2018-07-11 NOTE — Progress Notes (Signed)
Physical Therapy Treatment Patient Details Name: Benjamin Castaneda MRN: [id number] DOB: [birth year] Today's Date: 07/11/2018    History of Present Illness Benjamin Castaneda is a 63 y.o. male with a history of T2DM, BPH, and chronic pain who [facility] with dyspnea, found to have low grade fever, hyperglycemia, bibasilar infiltrates on CXR and covid-19 positive. He is a vague historian and complained of abdominal pain. CT abd/pelvis showed cholelithiasis without ductal dilation     PT Comments    Patient continues to demonstrate decreased balance and responses. Patient requires  UE support for ambulation. Tested in room without RW and noted  To be very guarded and very slow. and reaches for  For stabilization objects.  Patient sats on RA > 90% through out.  Patient  Will be challenged to be home alone due to weakness and decreased balance without UE support. Recommend  HHPT and RW.   Patient's wife  Not to Dc today and no other family availalble except to transport home.  Follow Up Recommendations  Supervision/Assistance - 24 hour;Home health PT     Equipment Recommendations  Rolling walker with 5" wheels    Recommendations for Other Services       Precautions / Restrictions Precautions Precaution Comments: monitor sats and HR    Mobility  Bed Mobility Overal bed mobility: Modified Independent             General bed mobility comments: Increased time and effort, slow and extra effort to get self upright  Transfers Overall transfer level: Needs assistance Equipment used: None Transfers: Sit to/from Stand Sit to Stand: Min guard         General transfer comment: requires several attmpts to stand up. patient had to stand and balance self, staggered slightly  Ambulation/Gait Ambulation/Gait assistance: Min guard;Min assist Gait Distance (Feet): 160 Feet Assistive device: Rolling walker (2 wheeled) Gait Pattern/deviations: Step-to pattern;Staggering  left;Staggering right     General Gait Details: patient  ambulated in room without use of RW, patient reaching for chair, window sill for stability. Had patient ambulate  through tight spaces, side step, open door and maneuvre around it. patient is very unsteady, moves very slow and guarded with and without RW.    Stairs             Wheelchair Mobility    Modified Rankin (Stroke Patients Only)       Balance     Sitting balance-Leahy Scale: Good     Standing balance support: During functional activity;Bilateral upper extremity supported;No upper extremity supported Standing balance-Leahy Scale: Poor Standing balance comment: relies on the UE support for stability. Decreasaed balance when let go of RW to open  a door and side step out of the way.                            Cognition Arousal/Alertness: Awake/alert                                            Exercises      General Comments        Pertinent Vitals/Pain Pain Assessment: No/denies pain    Home Living                      Prior Function  PT Goals (current goals can now be found in the care plan section) Progress towards PT goals: Progressing toward goals    Frequency    Min 4X/week      PT Plan Current plan remains appropriate;Discharge plan needs to be updated    Co-evaluation              AM-PAC PT "6 Clicks" Mobility   Outcome Measure  Help needed turning from your back to your side while in a flat bed without using bedrails?: None Help needed moving from lying on your back to sitting on the side of a flat bed without using bedrails?: None Help needed moving to and from a bed to a chair (including a wheelchair)?: A Little Help needed standing up from a chair using your arms (e.g., wheelchair or bedside chair)?: A Little Help needed to walk in hospital room?: A Lot Help needed climbing 3-5 steps with a railing? : A Lot 6 Click  Score: 18    End of Session Equipment Utilized During Treatment: Gait belt Activity Tolerance: Patient tolerated treatment well Patient left: in chair;with call bell/phone within reach Nurse Communication: Mobility status       Time: 0950-1010 PT Time Calculation (min) (ACUTE ONLY): 20 min  Charges:  $Gait Training: 8-22 mins                     Blanchard Kelch PT Acute Rehabilitation Services Pager [phone] Office [phone]    Rada Hay 07/11/2018, 11:08 AM

## 2018-07-11 NOTE — TOC Transition Note (Addendum)
Transition of Care [facility]) - CM/SW Discharge Note   Patient Details  Name: Benjamin Castaneda MRN: [id number] Date of Birth: 03-11-1956  [facility]) CM/SW Contact:  Colleen Can RN, BSN, NCM-BC, ACM-RN [phone] (working remotely) Phone Number: 07/11/2018, 10:42 AM   Clinical Narrative:    CM spoke to the patient via phone to discuss the POC. HH/DME recommended for the patients transition home. CM discussed recommendations for HHRN/PT & RW with the CMS [facility] Compare list explained via phone; [facility] selected; no preference for DME provider. Patient indicated his nephew will provide transportation home; his sisters will assist with any needs post transition. The RW will be provided to patient prior to transitioning home. No further needs from CM.   Addendum 4.23.20 @ 1511-Pari Lombard RNCM-CM informed by Marcy Siren OT that patient will need HHOT post discharge. HHOT orders added with Dara Hoyer; [facility] liaison updated.   Final next level of care: Home w Home Health Services Barriers to Discharge: No Barriers Identified   Patient Goals and CMS Choice Patient states their goals for this hospitalization and ongoing recovery are:: "Return home and be on my porch" CMS Medicare.gov Compare Post Acute Care list provided to:: Patient Choice offered to / list presented to : Patient  Discharge Plan and Services           DME Arranged: Walker rolling DME Agency: [employer] Date DME Agency Contacted: 07/11/18 HH Arranged: RN, Disease Management, PT, OT HH Agency: [facility] ([facility] Date HH Agency Contacted: 07/11/18 Time HH Agency Contacted: 1030 Representative spoke with at [facility] Agency: Dara Hoyer

## 2018-07-11 NOTE — Care Management (Signed)
CM spoke via phone to the [facility] who will provide the patient with a RW from [place] prior to the patient transitioning home.   Colleen Can RN, BSN, NCM-BC, ACM-RN [phone]

## 2018-07-11 NOTE — Discharge Instructions (Addendum)
Follow with [facility] in 7 days   Get CBC, CMP, 2 view Chest X ray -  checked  by Primary MD   in 5-7 days    Activity: As tolerated with Full fall precautions use walker/cane & assistance as needed  Disposition Home   Diet: Heart Healthy Low Carb.  Special Instructions: If you have smoked or chewed Tobacco  in the last 2 yrs please stop smoking, stop any regular Alcohol  and or any Recreational drug use.  On your next visit with your primary care physician please Get Medicines reviewed and adjusted.  Please request your Prim.MD to go over all Hospital Tests and Procedure/Radiological results at the follow up, please get all Hospital records sent to your Prim MD by signing hospital release before you go home.  If you experience worsening of your admission symptoms, develop shortness of breath, life threatening emergency, suicidal or homicidal thoughts you must seek medical attention immediately by calling 911 or calling your MD immediately  if symptoms less severe.  You Must read complete instructions/literature along with all the possible adverse reactions/side effects for all the Medicines you take and that have been prescribed to you. Take any new Medicines after you have completely understood and accpet all the possible adverse reactions/side effects.     You may not return to work/leave the home until at least 14 days since symptom onset AND 3 days without a fever (without taking tylenol, ibuprofen, etc.) and without a cough.  Please return to work only after you have been cleared to do so by your primary care physician.  Directions for you at home:  Wear a facemask - For the next 2 weeks  You should wear a facemask that covers your nose and mouth when you are in the same room with other people and when you visit a healthcare provider. People who live with or visit you should also wear a facemask while they are in the same room with you.  Separate  yourself from other people in your home As much as possible, you should stay in a different room from other people in your home. Also, you should use a separate bathroom, if available.  Avoid sharing household items You should not share dishes, drinking glasses, cups, eating utensils, towels, bedding, or other items with other people in your home. After using these items, you should wash them thoroughly with soap and water.  Cover your coughs and sneezes Cover your mouth and nose with a tissue when you cough or sneeze, or you can cough or sneeze into your sleeve. Throw used tissues in a lined trash can, and immediately wash your hands with soap and water for at least 20 seconds or use an alcohol-based hand rub.  Wash your [employer] your hands often and thoroughly with soap and water for at least 20 seconds. You can use an alcohol-based hand sanitizer if soap and water are not available and if your hands are not visibly dirty. Avoid touching your eyes, nose, and mouth with unwashed hands.  Directions for those who live with, or provide care at home for you:  Limit the number of people who have contact with the patient  If possible, have only one caregiver for the patient.  Other household members should stay in another home or place of residence. If this is not possible, they should stay  in another room, or be separated from the patient as much as possible. Use a separate  bathroom, if available.  Restrict visitors who do not have an essential need to be in the home.  Ensure good ventilation Make sure that shared spaces in the home have good air flow, such as from an air conditioner or an opened window, weather permitting.  Wash your hands often  Wash your hands often and thoroughly with soap and water for at least 20 seconds. You can use an alcohol based hand sanitizer if soap and water are not available and if your hands are not visibly dirty.  Avoid touching your eyes, nose,  and mouth with unwashed hands.  Use disposable paper towels to dry your hands. If not available, use dedicated cloth towels and replace them when they become wet.  Wear a facemask and gloves  Wear a disposable facemask at all times in the room and gloves when you touch or have contact with the patients blood, body fluids, and/or secretions or excretions, such as sweat, saliva, sputum, nasal mucus, vomit, urine, or feces.  Ensure the mask fits over your nose and mouth tightly, and do not touch it during use.  Throw out disposable facemasks and gloves after using them. Do not reuse.  Wash your hands immediately after removing your facemask and gloves.  If your personal clothing becomes contaminated, carefully remove clothing and launder. Wash your hands after handling contaminated clothing.  Place all used disposable facemasks, gloves, and other waste in a lined container before disposing them with other household waste.  Remove gloves and wash your hands immediately after handling these items.  Do not share dishes, glasses, or other household items with the patient  Avoid sharing household items. You should not share dishes, drinking glasses, cups, eating utensils, towels, bedding, or other items with a patient who is confirmed to have, or being evaluated for, COVID-19 infection.  After the person uses these items, you should wash them thoroughly with soap and water.  Wash laundry thoroughly  Immediately remove and wash clothes or bedding that have blood, body fluids, and/or secretions or excretions, such as sweat, saliva, sputum, nasal mucus, vomit, urine, or feces, on them.  Wear gloves when handling laundry from the patient.  Read and follow directions on labels of laundry or clothing items and detergent. In general, wash and dry with the warmest temperatures recommended on the label.  Clean all areas the individual has used often  Clean all touchable surfaces, such as counters,  tabletops, doorknobs, bathroom fixtures, toilets, phones, keyboards, tablets, and bedside tables, every day. Also, clean any surfaces that may have blood, body fluids, and/or secretions or excretions on them.  Wear gloves when cleaning surfaces the patient has come in contact with.  Use a diluted bleach solution (e.g., dilute bleach with 1 part bleach and 10 parts water) or a household disinfectant with a label that says EPA-registered for coronaviruses. To make a bleach solution at home, add 1 tablespoon of bleach to 1 quart (4 cups) of water. For a larger supply, add  cup of bleach to 1 gallon (16 cups) of water.  Read labels of cleaning products and follow recommendations provided on product labels. Labels contain instructions for safe and effective use of the cleaning product including precautions you should take when applying the product, such as wearing gloves or eye protection and making sure you have good ventilation during use of the product.  Remove gloves and wash hands immediately after cleaning.  Monitor yourself for signs and symptoms of illness Caregivers and household members are considered close  contacts, should monitor their health, and will be asked to limit movement outside of the home to the extent possible. Follow the monitoring steps for close contacts listed on the symptom monitoring form.

## 2018-07-11 NOTE — Progress Notes (Signed)
Inpatient Diabetes Program Recommendations  AACE/ADA: New Consensus Statement on Inpatient Glycemic Control   Target Ranges:  Prepandial:   less than 140 mg/dL      Peak postprandial:   less than 180 mg/dL (1-2 hours)      Critically ill patients:  140 - 180 mg/dL   Results for Benjamin Castaneda, Benjamin Castaneda (MRN [id number]) as of 07/11/2018 08:29  Ref. Range 07/10/2018 07:50 07/10/2018 11:48 07/10/2018 16:37 07/10/2018 21:07 07/11/2018 07:57  Glucose-Capillary Latest Ref Range: 70 - 99 mg/dL 637 (H) 858 (H) 850 (H) 223 (H) 256 (H)  Results for Benjamin Castaneda, Benjamin Castaneda (MRN [id number]) as of 07/11/2018 08:29  Ref. Range 07/07/2018 07:45  Hemoglobin A1C Latest Ref Range: 4.8 - 5.6 % 10.7 (H)   Review of Glycemic Control  Diabetes history: DM2 Outpatient Diabetes medications: Glipizide XL 20 mg daily, Metformin 1000 mg BID, Tradjenta 5 mg daily Current orders for Inpatient glycemic control: Lantus 20 units daily, Novolog 6 units TID with meals, Novolog 0-15 units TID with meals, Novolog 0-5 units QHS, Tradjenta 5 mg daily  Inpatient Diabetes Program Recommendations:   Insulin - Basal: Please consider increasing Lantus to 25 units daily starting today.  Insulin-Meal Coverage: Please consider increasing meal coverage to Novolog 10 units TID with meals.  HgbA1C: A1C 10.7% on 07/07/18 indicating an average glucose of 260 mg/dl over the past 2-3 months  Thanks, Orlando Penner, RN, MSN, CDE Diabetes Coordinator Inpatient Diabetes Program [phone] (Team Pager from 8am to 5pm)

## 2018-07-11 NOTE — Progress Notes (Signed)
Discharged patient via wheelchair.  Discussed COVID monitoring and patient's deconditioned state and generalized weakness with patient and patient's nephew.  Nephew verbalized understanding of precautions.  Discussed patient should not drive a vehicle at this point, nephew verbalized understanding and indicated he would find another family member to drive patient's car home from [facility].

## 2018-07-11 NOTE — Discharge Summary (Signed)
Benjamin Castaneda BJY:[id number] DOB: 02/19/1956 DOA: 07/06/2018  PCP: [facility]  Admit date: 07/06/2018  Discharge date: 07/11/2018  Admitted From: Home   Disposition:  Home   Recommendations for Outpatient Follow-up:   Follow up with PCP in 1-2 weeks  PCP Please obtain BMP/CBC, 2 view CXR in 1week,  (see Discharge instructions)   PCP Please follow up on the following pending results:    Home Health: PT,RN   Equipment/Devices: Rolling walker  Consultations: None Discharge Condition: Stable   CODE STATUS: Full   Diet Recommendation: Heart Healthy Low Carb  CC - SOB   Brief history of present illness from the day of admission and additional interim summary    Benjamin T Smithis a63 y.o.malewith a history of T2DM, BPH, and chronic pain who presented to [facility] ED 4/17 with dyspnea, found to have low grade fever, hyperglycemia, bibasilar infiltrates on CXR and covid-19 positive. He is a vague historian and complained of abdominal pain. CT abd/pelvis showed cholelithiasis without ductal dilation or inflammation and no other intraabdominal pathology, but there were GGOs with bibasilar predominance, areas of subpleural sparing, and intralobular septal thickening. He was given azithromycin and ceftriaxone before transfer to [facility] 4/18. Antibiotics stopped as procalcitonin was undetectable. Fevers and inflammatory markers trending upward and hydroxychloroquine started 4/19.                                                                  Hospital Course    Acute hypoxic respiratory failure due to covid-19 viral pneumonia: Was treated with hydroxychloroquine finished treatment today on 07/11/2018, clinically stable on room air, symptom-free.  Does have some deconditioning for which she will be given rolling  walker along with PT and RN at home.  Return COVID instructions provided requested to follow with PCP in a week.  Currently close to his baseline.   Anemia of acute disease: Hgb stable >12g/dl, CP to recheck CBC in a week and do outpatient anemia work-up as appropriate.    NIDT2DM with hyperglycemia: HbA1c 10.7% indicating chronically poor control.  Continue home regimen, requested to be compliant with his diet, PCP to monitor glycemic control.    Hyperbilirubinemia: Resolved. In the absence of other LFT elevations, no abdominal pain or subjective complaints, does have gallstones on CT scan.  PCP can arrange for outpatient surgery follow-up in a month, repeat CMP in a week.   BPH:  - Continue flomax, no acute issues.  Hypokalemia:  -Replaced, PCP to recheck next visit in a week  Chronic pain:  - Continue tramadol, PRN Tylenol provided. - Avoid NSAIDs.  Obesity: BMI 31.  - Weight loss recommended long term, follow-up with PCP.   Discharge diagnosis     Principal Problem:   Acute respiratory disease due to COVID-19 virus Active  Problems:   Type 2 diabetes mellitus without complication (HCC)   Chronic pain   Hyperbilirubinemia   BPH (benign prostatic hyperplasia)    Discharge instructions    Discharge Instructions    Discharge instructions   Complete by:  As directed    Follow with [facility] in 7 days   Get CBC, CMP, 2 view Chest X ray -  checked  by Primary MD   in 5-7 days    Activity: As tolerated with Full fall precautions use walker/cane & assistance as needed  Disposition Home   Diet: Heart Healthy Low Carb.  Special Instructions: If you have smoked or chewed Tobacco  in the last 2 yrs please stop smoking, stop any regular Alcohol  and or any Recreational drug use.  On your next visit with your primary care physician please Get Medicines reviewed and adjusted.  Please request your Prim.MD to go over all Hospital Tests  and Procedure/Radiological results at the follow up, please get all Hospital records sent to your Prim MD by signing hospital release before you go home.  If you experience worsening of your admission symptoms, develop shortness of breath, life threatening emergency, suicidal or homicidal thoughts you must seek medical attention immediately by calling 911 or calling your MD immediately  if symptoms less severe.  You Must read complete instructions/literature along with all the possible adverse reactions/side effects for all the Medicines you take and that have been prescribed to you. Take any new Medicines after you have completely understood and accpet all the possible adverse reactions/side effects.     You may not return to work/leave the home until at least 14 days since symptom onset AND 3 days without a fever (without taking tylenol, ibuprofen, etc.) and without a cough.  Please return to work only after you have been cleared to do so by your primary care physician.  Directions for you at home:  Wear a facemask - For the next 2 weeks  You should wear a facemask that covers your nose and mouth when you are in the same room with other people and when you visit a healthcare provider. People who live with or visit you should also wear a facemask while they are in the same room with you.  Separate yourself from other people in your home As much as possible, you should stay in a different room from other people in your home. Also, you should use a separate bathroom, if available.  Avoid sharing household items You should not share dishes, drinking glasses, cups, eating utensils, towels, bedding, or other items with other people in your home. After using these items, you should wash them thoroughly with soap and water.  Cover your coughs and sneezes Cover your mouth and nose with a tissue when you cough or sneeze, or you can cough or sneeze into your sleeve. Throw used tissues in a lined  trash can, and immediately wash your hands with soap and water for at least 20 seconds or use an alcohol-based hand rub.  Wash your Union Pacific Corporationhands Wash your hands often and thoroughly with soap and water for at least 20 seconds. You can use an alcohol-based hand sanitizer if soap and water are not available and if your hands are not visibly dirty. Avoid touching your eyes, nose, and mouth with unwashed hands.  Directions for those who live with, or provide care at home for you:  Limit the number of people who have contact with the  patient If possible, have only one caregiver for the patient. Other household members should stay in another home or place of residence. If this is not possible, they should stay in another room, or be separated from the patient as much as possible. Use a separate bathroom, if available. Restrict visitors who do not have an essential need to be in the home.  Ensure good ventilation Make sure that shared spaces in the home have good air flow, such as from an air conditioner or an opened window, weather permitting.  Wash your hands often Wash your hands often and thoroughly with soap and water for at least 20 seconds. You can use an alcohol based hand sanitizer if soap and water are not available and if your hands are not visibly dirty. Avoid touching your eyes, nose, and mouth with unwashed hands. Use disposable paper towels to dry your hands. If not available, use dedicated cloth towels and replace them when they become wet.  Wear a facemask and gloves Wear a disposable facemask at all times in the room and gloves when you touch or have contact with the patient's blood, body fluids, and/or secretions or excretions, such as sweat, saliva, sputum, nasal mucus, vomit, urine, or feces.  Ensure the mask fits over your nose and mouth tightly, and do not touch it during use. Throw out disposable facemasks and gloves after using them. Do not reuse. Wash your hands immediately  after removing your facemask and gloves. If your personal clothing becomes contaminated, carefully remove clothing and launder. Wash your hands after handling contaminated clothing. Place all used disposable facemasks, gloves, and other waste in a lined container before disposing them with other household waste. Remove gloves and wash your hands immediately after handling these items.  Do not share dishes, glasses, or other household items with the patient Avoid sharing household items. You should not share dishes, drinking glasses, cups, eating utensils, towels, bedding, or other items with a patient who is confirmed to have, or being evaluated for, COVID-19 infection. After the person uses these items, you should wash them thoroughly with soap and water.  Wash laundry thoroughly Immediately remove and wash clothes or bedding that have blood, body fluids, and/or secretions or excretions, such as sweat, saliva, sputum, nasal mucus, vomit, urine, or feces, on them. Wear gloves when handling laundry from the patient. Read and follow directions on labels of laundry or clothing items and detergent. In general, wash and dry with the warmest temperatures recommended on the label.  Clean all areas the individual has used often Clean all touchable surfaces, such as counters, tabletops, doorknobs, bathroom fixtures, toilets, phones, keyboards, tablets, and bedside tables, every day. Also, clean any surfaces that may have blood, body fluids, and/or secretions or excretions on them. Wear gloves when cleaning surfaces the patient has come in contact with. Use a diluted bleach solution (e.g., dilute bleach with 1 part bleach and 10 parts water) or a household disinfectant with a label that says EPA-registered for coronaviruses. To make a bleach solution at home, add 1 tablespoon of bleach to 1 quart (4 cups) of water. For a larger supply, add  cup of bleach to 1 gallon (16 cups) of water. Read labels of  cleaning products and follow recommendations provided on product labels. Labels contain instructions for safe and effective use of the cleaning product including precautions you should take when applying the product, such as wearing gloves or eye protection and making sure you have good ventilation during use of  the product. Remove gloves and wash hands immediately after cleaning.  Monitor yourself for signs and symptoms of illness Caregivers and household members are considered close contacts, should monitor their health, and will be asked to limit movement outside of the home to the extent possible. Follow the monitoring steps for close contacts listed on the symptom monitoring form.   Increase activity slowly   Complete by:  As directed    MyChart COVID-19 home monitoring program   Complete by:  Jul 11, 2018    Is the patient willing to use a smartphone for remote monitoring via the MyChart app?:  Yes   Temperature monitoring   Complete by:  Jul 11, 2018    After how many days would you like to receive a notification of this patient's flowsheet entries?:  1      Discharge Medications   Allergies as of 07/11/2018   No Known Allergies     Medication List    TAKE these medications   acetaminophen 325 MG tablet Commonly known as:  Tylenol Take 1 tablet (325 mg total) by mouth every 6 (six) hours as needed for moderate pain.   glipiZIDE 10 MG 24 hr tablet Commonly known as:  GLUCOTROL XL Take 20 mg by mouth daily.   metFORMIN 500 MG tablet Commonly known as:  GLUCOPHAGE Take 1,000 mg by mouth 2 (two) times daily.   tamsulosin 0.4 MG Caps capsule Commonly known as:  FLOMAX Take 0.4 mg by mouth daily.   Tradjenta 5 MG Tabs tablet Generic drug:  linagliptin Take 5 mg by mouth daily.            Durable Medical Equipment  (From admission, onward)         Start     Ordered   07/11/18 0949  For home use only DME Walker rolling  [facility])  Once    Question:  Patient  needs a walker to treat with the following condition  Answer:  Acute respiratory disease due to COVID-19 virus   07/11/18 0949          Follow-up Information    [facility] Schedule an appointment as soon as possible for a visit in 1 week(s).   Contact information: [address] [place] [address] [phone]           Major procedures and Radiology Reports - PLEASE review detailed and final reports thoroughly  -       Ct Chest W Contrast  Result Date: 07/06/2018 CLINICAL DATA:  63 y/o  M; epigastric abdominal pain and cough. EXAM: CT CHEST, ABDOMEN, AND PELVIS WITH CONTRAST TECHNIQUE: Multidetector CT imaging of the chest, abdomen and pelvis was performed following the standard protocol during bolus administration of intravenous contrast. CONTRAST:  OMNIPAQUE IOHEXOL 300 MG/ML  SOLN COMPARISON:  02/21/2019 CT of chest, abdomen, pelvis. FINDINGS: CT CHEST FINDINGS Cardiovascular: No significant vascular findings. Normal heart size. No pericardial effusion. Mediastinum/Nodes: No enlarged mediastinal, hilar, or axillary lymph nodes. Thyroid gland, trachea, and esophagus demonstrate no significant findings. Lungs/Pleura: Ground-glass opacities with peripheral basilar predominance, greater in the left lung with with areas of subpleural sparing and intralobular septal thickening. No consolidation, effusion, or pneumothorax. Musculoskeletal: No chest wall mass or suspicious bone lesions identified. CT ABDOMEN PELVIS FINDINGS Hepatobiliary: No focal liver abnormality is seen. Cholelithiasis. No gallbladder wall thickening or biliary ductal dilatation. Pancreas: Unremarkable. No pancreatic ductal dilatation or surrounding inflammatory changes. Spleen: Normal in size without focal abnormality. Adrenals/Urinary  Tract: Adrenal glands are unremarkable. Kidneys are normal, without renal calculi, focal lesion, or hydronephrosis. Bladder is unremarkable. Stomach/Bowel: Stomach  is within normal limits. Appendix not identified, no pericecal inflammation. No evidence of bowel wall thickening, distention, or inflammatory changes. Vascular/Lymphatic: Aortic atherosclerosis. No enlarged abdominal or pelvic lymph nodes. Reproductive: Prostate is unremarkable. Other: No abdominal wall hernia or abnormality. No abdominopelvic ascites. Musculoskeletal: No fracture is seen. IMPRESSION: 1. There are a spectrum of findings in the lungs which can be seen with acute atypical infection (as well as other non-infectious etiologies). In particular, viral pneumonia (including COVID-19) should be considered in the appropriate clinical setting. 2. Aortic atherosclerosis. 3. Cholelithiasis. Critical Value/emergent results were called by telephone at the time of interpretation on 07/06/2018 at 1:43 am to Dr. Manson Passey, who verbally acknowledged these results. Electronically Signed   By: Mitzi Hansen M.D.   On: 07/06/2018 01:43   Ct Abdomen Pelvis W Contrast  Result Date: 07/06/2018 CLINICAL DATA:  63 y/o  M; epigastric abdominal pain and cough. EXAM: CT CHEST, ABDOMEN, AND PELVIS WITH CONTRAST TECHNIQUE: Multidetector CT imaging of the chest, abdomen and pelvis was performed following the standard protocol during bolus administration of intravenous contrast. CONTRAST:  OMNIPAQUE IOHEXOL 300 MG/ML  SOLN COMPARISON:  02/21/2019 CT of chest, abdomen, pelvis. FINDINGS: CT CHEST FINDINGS Cardiovascular: No significant vascular findings. Normal heart size. No pericardial effusion. Mediastinum/Nodes: No enlarged mediastinal, hilar, or axillary lymph nodes. Thyroid gland, trachea, and esophagus demonstrate no significant findings. Lungs/Pleura: Ground-glass opacities with peripheral basilar predominance, greater in the left lung with with areas of subpleural sparing and intralobular septal thickening. No consolidation, effusion, or pneumothorax. Musculoskeletal: No chest wall mass or suspicious bone  lesions identified. CT ABDOMEN PELVIS FINDINGS Hepatobiliary: No focal liver abnormality is seen. Cholelithiasis. No gallbladder wall thickening or biliary ductal dilatation. Pancreas: Unremarkable. No pancreatic ductal dilatation or surrounding inflammatory changes. Spleen: Normal in size without focal abnormality. Adrenals/Urinary Tract: Adrenal glands are unremarkable. Kidneys are normal, without renal calculi, focal lesion, or hydronephrosis. Bladder is unremarkable. Stomach/Bowel: Stomach is within normal limits. Appendix not identified, no pericecal inflammation. No evidence of bowel wall thickening, distention, or inflammatory changes. Vascular/Lymphatic: Aortic atherosclerosis. No enlarged abdominal or pelvic lymph nodes. Reproductive: Prostate is unremarkable. Other: No abdominal wall hernia or abnormality. No abdominopelvic ascites. Musculoskeletal: No fracture is seen. IMPRESSION: 1. There are a spectrum of findings in the lungs which can be seen with acute atypical infection (as well as other non-infectious etiologies). In particular, viral pneumonia (including COVID-19) should be considered in the appropriate clinical setting. 2. Aortic atherosclerosis. 3. Cholelithiasis. Critical Value/emergent results were called by telephone at the time of interpretation on 07/06/2018 at 1:43 am to Dr. Manson Passey, who verbally acknowledged these results. Electronically Signed   By: Mitzi Hansen M.D.   On: 07/06/2018 01:43   Dg Chest Portable 1 View  Result Date: 07/05/2018 CLINICAL DATA:  63 y/o  M; 2 weeks of worsening shortness of breath. EXAM: PORTABLE CHEST 1 VIEW COMPARISON:  02/20/2018 CT chest FINDINGS: Normal cardiac silhouette given projection and technique. Left mid and lower lung zone infiltrate. No pleural effusion or pneumothorax. Bones are unremarkable. IMPRESSION: Left mid and lower lung zone infiltrate. Electronically Signed   By: Mitzi Hansen M.D.   On: 07/05/2018 22:49     Micro Results     Recent Results (from the past 240 hour(s))  Culture, blood (routine x 2)     Status: None   Collection Time: 07/05/18 11:12 PM  Result Value Ref Range Status   Specimen Description BLOOD RIGHT ANTECUBITAL  Final   Special Requests   Final    BOTTLES DRAWN AEROBIC AND ANAEROBIC Blood Culture results may not be optimal due to an excessive volume of blood received in culture bottles   Culture   Final    NO GROWTH 5 DAYS Performed at [facility], [address] [place], [place] [address]    Report Status 07/10/2018 FINAL  Final  Culture, blood (routine x 2)     Status: None   Collection Time: 07/05/18 11:12 PM  Result Value Ref Range Status   Specimen Description BLOOD LEFT FOREARM  Final   Special Requests   Final    BOTTLES DRAWN AEROBIC AND ANAEROBIC Blood Culture results may not be optimal due to an excessive volume of blood received in culture bottles   Culture   Final    NO GROWTH 5 DAYS Performed at [facility], [address] [place], [place] [address]    Report Status 07/11/2018 FINAL  Final  SARS Coronavirus 2 [facility] order, Performed in [facility] hospital lab)     Status: Abnormal   Collection Time: 07/05/18 11:12 PM  Result Value Ref Range Status   SARS Coronavirus 2 POSITIVE (A) NEGATIVE Final    Comment: RESULT CALLED TO, READ BACK BY AND VERIFIED WITH: NOEL WEBSTER AT 0026 07/06/2018.  TFK (NOTE) If result is NEGATIVE SARS-CoV-2 target nucleic acids are NOT DETECTED. The SARS-CoV-2 RNA is generally detectable in upper and lower  respiratory specimens during the acute phase of infection. The lowest  concentration of SARS-CoV-2 viral copies this assay can detect is 250  copies / mL. A negative result does not preclude SARS-CoV-2 infection  and should not be used as the sole basis for treatment or other  patient management decisions.  A negative result may occur with  improper specimen collection / handling,  submission of specimen other  than nasopharyngeal swab, presence of viral mutation(s) within the  areas targeted by this assay, and inadequate number of viral copies  (<250 copies / mL). A negative result must be combined with clinical  observations, patient history, and epidemiological information. If result is POSITIVE SARS-CoV-2 target nucleic acids are DETECTED.  The SARS-CoV-2 RNA is generally detectable in upper and lower  respiratory specimens during the acute phase of infection.  Positive  results are indicative of active infection with SARS-CoV-2.  Clinical  correlation with patient history and other diagnostic information is  necessary to determine patient infection status.  Positive results do  not rule out bacterial infection or co-infection with other viruses. If result is PRESUMPTIVE POSTIVE SARS-CoV-2 nucleic acids MAY BE PRESENT.   A presumptive positive result was obtained on the submitted specimen  and confirmed on repeat testing.  While 2019 novel coronavirus  (SARS-CoV-2) nucleic acids may be present in the submitted sample  additional confirmatory testing may be necessary for epidemiological  and / or clinical management purposes  to differentiate between  SARS-CoV-2 and other Sarbecovirus currently known to infect humans.  If clinically indicated additional testing with an alternate test  methodology [id number])  is advised. The SARS-CoV-2 RNA is generally  detectable in upper and lower respiratory specimens during the acute  phase of infection. The expected result is Negative. Fact Sheet for Patients:  BoilerBrush.com.cy Fact Sheet for Healthcare Providers: https://pope.com/ This test is not yet approved or cleared by the [place] FDA and has been authorized for detection and/or  diagnosis of SARS-CoV-2 by FDA under an Emergency Use Authorization (EUA).  This EUA will remain in effect (meaning this test can be  used) for the duration of the COVID-19 declaration under Section 564(b)(1) of the Act, 21 U.S.C. section 360bbb-3(b)(1), unless the authorization is terminated or revoked sooner. Performed at [facility], [address], [place], [place] [address]     Today   Subjective    Benjamin Castaneda today has no headache,no chest abdominal pain,no new weakness tingling or numbness, feels much better wants to go home today.     Objective   Blood pressure 125/67, pulse 64, temperature 98.4 F (36.9 C), temperature source Oral, resp. rate 15, height  (1.88 m), weight 103 kg, SpO2 92 %.   Intake/Output Summary (Last 24 hours) at 07/11/2018 0949 Last data filed at 07/11/2018 0432 Gross per 24 hour  Intake 100 ml  Output 850 ml  Net -750 ml    Exam  Tele Interview done actual exam deferred for my partner Dr. Jerral Ralph   Data Review   CBC w Diff:  Lab Results  Component Value Date   WBC 4.1 07/11/2018   HGB 11.7 (L) 07/11/2018   HGB 14.2 10/08/2012   HCT 35.3 (L) 07/11/2018   HCT 41.5 10/08/2012   PLT 339 07/11/2018   PLT 286 10/08/2012   LYMPHOPCT 38 07/11/2018   MONOPCT 11 07/11/2018   EOSPCT 2 07/11/2018   BASOPCT 1 07/11/2018    CMP:  Lab Results  Component Value Date   NA 139 07/11/2018   NA 141 10/08/2012   K 3.3 (L) 07/11/2018   K 3.6 10/08/2012   CL 104 07/11/2018   CL 108 (H) 10/08/2012   CO2 26 07/11/2018   CO2 27 10/08/2012   BUN 12 07/11/2018   BUN 15 10/08/2012   CREATININE 0.68 07/11/2018   CREATININE 0.71 10/08/2012   PROT 6.9 07/11/2018   ALBUMIN 2.8 (L) 07/11/2018   BILITOT 0.6 07/11/2018   ALKPHOS 70 07/11/2018   AST 17 07/11/2018   ALT 15 07/11/2018  .   Total Time in preparing paper work, data evaluation and todays exam - 35 minutes  Susa Raring M.D on 07/11/2018 at 9:49 AM  [facility]   Office  [phone]

## 2018-07-13 ENCOUNTER — Other Ambulatory Visit: Payer: Self-pay | Admitting: Obstetrics and Gynecology

## 2018-07-18 ENCOUNTER — Telehealth (INDEPENDENT_AMBULATORY_CARE_PROVIDER_SITE_OTHER): Payer: Self-pay | Admitting: General Practice

## 2018-07-18 NOTE — Telephone Encounter (Signed)
Late entry from 07/16/2018 - texted link to setup MyChart to pt but he said he didn't know if he'd be able to do it as he cannot read well.

## 2018-07-23 ENCOUNTER — Other Ambulatory Visit: Payer: Self-pay | Admitting: Obstetrics and Gynecology

## 2018-08-13 ENCOUNTER — Other Ambulatory Visit: Payer: Self-pay | Admitting: Obstetrics and Gynecology

## 2018-08-14 ENCOUNTER — Telehealth: Payer: Self-pay | Admitting: *Deleted

## 2018-08-14 NOTE — Telephone Encounter (Signed)
I called pt in regards to donating his plasma since he was positive for the COVID-19.  After explaining the process and that I was going to give him the web site he let me know he did not have a computer.   I let him know that was ok.   He thanked me for calling.

## 2018-09-09 ENCOUNTER — Other Ambulatory Visit: Payer: Self-pay | Admitting: Obstetrics and Gynecology

## 2020-09-17 DEATH — deceased
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
## 2023-01-19 DEATH — deceased
